# Patient Record
Sex: Female | Born: 1945
Health system: Southern US, Community
[De-identification: ages and names within clinical notes are randomized; demographics above are authoritative.]

## PROBLEM LIST (undated history)

## (undated) DIAGNOSIS — I10 Essential (primary) hypertension: Secondary | ICD-10-CM

## (undated) DIAGNOSIS — N390 Urinary tract infection, site not specified: Secondary | ICD-10-CM

## (undated) DIAGNOSIS — Z9889 Other specified postprocedural states: Secondary | ICD-10-CM

## (undated) DIAGNOSIS — H269 Unspecified cataract: Secondary | ICD-10-CM

## (undated) DIAGNOSIS — E782 Mixed hyperlipidemia: Secondary | ICD-10-CM

## (undated) DIAGNOSIS — M810 Age-related osteoporosis without current pathological fracture: Secondary | ICD-10-CM

## (undated) HISTORY — DX: Other specified postprocedural states: Z98.890

## (undated) HISTORY — DX: Mixed hyperlipidemia: E78.2

## (undated) HISTORY — PX: CYSTOSCOPY: SUR368

## (undated) HISTORY — DX: Urinary tract infection, site not specified: N39.0

## (undated) HISTORY — PX: LIPOMA EXCISION: SHX5283

## (undated) HISTORY — PX: FOOT SURGERY: SHX648

## (undated) HISTORY — DX: Age-related osteoporosis without current pathological fracture: M81.0

## (undated) HISTORY — DX: Essential (primary) hypertension: I10

## (undated) HISTORY — DX: Unspecified cataract: H26.9

## (undated) HISTORY — PX: ABDOMINAL HYSTERECTOMY: SHX81

---

## 2001-04-29 ENCOUNTER — Other Ambulatory Visit: Admission: RE | Admit: 2001-04-29 | Discharge: 2001-04-29 | Payer: Self-pay | Admitting: Obstetrics and Gynecology

## 2002-07-23 ENCOUNTER — Ambulatory Visit (HOSPITAL_COMMUNITY): Admission: RE | Admit: 2002-07-23 | Discharge: 2002-07-23 | Payer: Self-pay | Admitting: Pulmonary Disease

## 2004-05-08 ENCOUNTER — Ambulatory Visit (HOSPITAL_COMMUNITY): Admission: RE | Admit: 2004-05-08 | Discharge: 2004-05-08 | Payer: Self-pay | Admitting: Family Medicine

## 2004-06-20 ENCOUNTER — Ambulatory Visit (HOSPITAL_COMMUNITY): Admission: RE | Admit: 2004-06-20 | Discharge: 2004-06-20 | Payer: Self-pay | Admitting: Urology

## 2004-06-20 ENCOUNTER — Ambulatory Visit (HOSPITAL_BASED_OUTPATIENT_CLINIC_OR_DEPARTMENT_OTHER): Admission: RE | Admit: 2004-06-20 | Discharge: 2004-06-20 | Payer: Self-pay | Admitting: Urology

## 2004-09-07 ENCOUNTER — Ambulatory Visit: Payer: Self-pay | Admitting: Internal Medicine

## 2006-03-19 ENCOUNTER — Ambulatory Visit: Payer: Self-pay | Admitting: *Deleted

## 2006-03-26 ENCOUNTER — Ambulatory Visit (HOSPITAL_COMMUNITY): Admission: RE | Admit: 2006-03-26 | Discharge: 2006-03-26 | Payer: Self-pay | Admitting: Family Medicine

## 2007-03-25 ENCOUNTER — Ambulatory Visit: Payer: Self-pay | Admitting: *Deleted

## 2007-04-01 ENCOUNTER — Ambulatory Visit (HOSPITAL_COMMUNITY): Admission: RE | Admit: 2007-04-01 | Discharge: 2007-04-01 | Payer: Self-pay | Admitting: Family Medicine

## 2007-05-26 ENCOUNTER — Ambulatory Visit (HOSPITAL_COMMUNITY): Admission: RE | Admit: 2007-05-26 | Discharge: 2007-05-26 | Payer: Self-pay | Admitting: Family Medicine

## 2010-07-12 ENCOUNTER — Ambulatory Visit (HOSPITAL_COMMUNITY): Admission: RE | Admit: 2010-07-12 | Discharge: 2010-07-12 | Payer: Self-pay | Admitting: Family Medicine

## 2011-03-06 NOTE — Letter (Signed)
March 25, 2007    Corrie Mckusick, M.D.  [address] [place]. [place], [place] [address]   RE:  [name], [name]  MRN:  [id number]  /  DOB:  1945/12/11   Dear Dr. Phillips Odor,   It was a pleasure to see your nice patient, [name], for followup  on March 25, 2007. As you know, she is a very pleasant, 65 year old, white  female with a history of multiple risk factors and no significant  obstructive coronary disease at the time of coronary angiography in  1998.   Stress Cardiolite in February 2004 revealed normal systolic function  with normal myocardial perfusion. In February 2005, she was evaluated  for syncope. She also has a urinary tract infection per Dr. Eudelia Bunch.  She has a history of hyperlipidemia and is unable to take statins,  cigarette abuse. She has regained some of the previous weight loss.   MEDICATIONS:  Aspirin 81 and Macrodantin.   PHYSICAL EXAMINATION:  VITAL SIGNS:  Blood pressure 166/104, this was  confirmed. The patient states that this is unusual for her. Pulse was  90, normal sinus rhythm.  GENERAL:  She appears pale but her mucous membranes are not pail.  HEENT:  JVP is not elevated. Carotid pulses are palpable and equal  without bruits.  LUNGS:  Clear.  CARDIAC:  Reveals no significant murmur.  ABDOMEN:  Unremarkable.  EXTREMITIES:  Normal.   EKG reveals normal sinus rhythm, left anterior fascicular block and ST-T  abnormalities.   IMPRESSION:  1. Significant hypertension.  2. Hyperlipidemia.  3. Abnormal EKG with ST-T abnormality, prolonged QT possibly related      to hypertension.   I suggested Lotrel 5/10; however, the patient states that she will  probably not take it. She states that she is to see you next week. I  certainly encouraged her to take her medications.   I have suggested she see Dr. Dietrich Pates in our [place] office in 6  months or sooner if necessary.   Thank you for the opportunity to share in this nice patient's  care.    Sincerely,      E. Graceann Congress, MD, [facility]  Electronically Signed    EJL/MedQ  DD: 03/25/2007  DT: 03/26/2007  Job #: [id number]

## 2011-03-09 NOTE — Op Note (Signed)
NAME:  [name], [name]                          ACCOUNT NO.:  [id number]   MEDICAL RECORD NO.:  [id number]                   PATIENT TYPE:  AMB   LOCATION:  [facility]                                 FACILITY:  [facility]   PHYSICIAN:  Jamison Neighbor, M.D.               DATE OF BIRTH:  [birth year]   DATE OF PROCEDURE:  06/20/2004  DATE OF DISCHARGE:                                 OPERATIVE REPORT   PREOPERATIVE DIAGNOSES:  1.  Chronic cystitis.  2.  Microscopic hematuria.   POSTOPERATIVE DIAGNOSES:  1.  Chronic cystitis.  2.  Microscopic hematuria.   PROCEDURE:  1.  Cystoscopy.  2.  Bilateral retrogrades.   SURGEON:  Jamison Neighbor, M.D.   ANESTHESIA:  General.   COMPLICATIONS:  None.   DRAINS:  None.   BRIEF HISTORY:  This 65 year old female has had longstanding problems with  both chronic cystitis and microscopic hematuria.  She has had previous upper  tract studies as well as office cystoscopy but has had persistent hematuria  as well as poorly controlled infections.  She is now to undergo cystoscopy  and retrogrades along with biopsy if appropriate.  She understands the risks  and benefits of the procedure and gave full informed consent.   DESCRIPTION OF PROCEDURE:  After the successful induction of general  anesthesia, the patient was placed in the dorsal lithotomy position, prepped  with Betadine, and draped in the usual sterile fashion.  Careful bimanual  examination revealed no abnormalities of the bladder or rectum.  There was  no cystocele, rectocele, or enterocele.  There were no masses on bimanual  exam.  The urethra was of normal caliber accepting a 55 [place] female  urethral sound with no evidence of stenosis or stricture.  The cystoscope  was inserted, and the bladder was carefully inspected.  It was free of any  tumor or stones.  Both ureteral orifices were normal in configuration and  location.  Both the 12-degree and 70-degree lens were utilized.  Retrograde  studies were performed bilaterally through a 6 French open-end catheter.  The patient had normal ureters with no strictures or filling defects and  normal collecting system with multiple finely cupped calices and no signs of  hydronephrosis or mass lesion.  The bladder was drained.  The patient  tolerated the procedure well and was taken to the recovery room in good  condition.                                              Jamison Neighbor, M.D.   RJE/MEDQ  D:  06/20/2004  T:  06/20/2004  Job:  [id number]

## 2011-03-21 ENCOUNTER — Other Ambulatory Visit: Payer: Self-pay | Admitting: Ophthalmology

## 2011-03-21 ENCOUNTER — Encounter (HOSPITAL_COMMUNITY): Payer: Medicare Other

## 2011-03-21 LAB — HEMOGLOBIN AND HEMATOCRIT, BLOOD
HCT: 37.8 % (ref 36.0–46.0)
Hemoglobin: 12.1 g/dL (ref 12.0–15.0)

## 2011-03-21 LAB — BASIC METABOLIC PANEL
Calcium: 10 mg/dL (ref 8.4–10.5)
Chloride: 99 mEq/L (ref 96–112)
Creatinine, Ser: 1.24 mg/dL — ABNORMAL HIGH (ref 0.4–1.2)
GFR calc Af Amer: 52 mL/min — ABNORMAL LOW (ref >60)

## 2011-03-22 ENCOUNTER — Ambulatory Visit (HOSPITAL_COMMUNITY)
Admission: RE | Admit: 2011-03-22 | Discharge: 2011-03-22 | Disposition: A | Discharge status: Home / Self Care | Payer: Medicare Other | Source: Ambulatory Visit | Attending: Ophthalmology | Admitting: Ophthalmology

## 2011-03-22 DIAGNOSIS — J4489 Other specified chronic obstructive pulmonary disease: Secondary | ICD-10-CM | POA: Insufficient documentation

## 2011-03-22 DIAGNOSIS — H2510 Age-related nuclear cataract, unspecified eye: Secondary | ICD-10-CM | POA: Insufficient documentation

## 2011-03-22 DIAGNOSIS — I10 Essential (primary) hypertension: Secondary | ICD-10-CM | POA: Insufficient documentation

## 2011-03-22 DIAGNOSIS — Z01812 Encounter for preprocedural laboratory examination: Secondary | ICD-10-CM | POA: Insufficient documentation

## 2011-03-22 DIAGNOSIS — Z79899 Other long term (current) drug therapy: Secondary | ICD-10-CM | POA: Insufficient documentation

## 2011-03-22 DIAGNOSIS — J449 Chronic obstructive pulmonary disease, unspecified: Secondary | ICD-10-CM | POA: Insufficient documentation

## 2011-04-02 NOTE — Op Note (Signed)
  NAMEAIZLYN, SCHIFANO                ACCOUNT NO.:  [id number]  MEDICAL RECORD NO.:  [id number]           PATIENT TYPE:  O  LOCATION:  [facility]                          FACILITY:  [facility]  PHYSICIAN:  Susanne Greenhouse, MD       DATE OF BIRTH:  Feb 22, 1946  DATE OF PROCEDURE:  03/22/2011 DATE OF DISCHARGE:                              OPERATIVE REPORT   PREOPERATIVE DIAGNOSIS:  Nuclear cataract, left eye, diagnosis code 366.16.  POSTOPERATIVE DIAGNOSIS:  Nuclear cataract, left eye, diagnosis code 366.16.  OPERATION PERFORMED:  Phacoemulsification with posterior chamber intraocular lens implantation, left eye.  SURGEON:  Susanne Greenhouse, MD  ANESTHESIA:  General endotracheal anesthesia.  OPERATIVE SUMMARY:  In the preoperative area, dilating drops were placed into the left eye.  The patient was then brought into the operating room where she was placed under general anesthesia.  The eye was then prepped and draped.  Beginning with a 75 blade, a paracentesis port was made at the surgeon's 2 o'clock position.  The anterior chamber was then filled with a 1% nonpreserved lidocaine solution with epinephrine.  This was followed by Viscoat to deepen the chamber.  A small fornix-based peritomy was performed superiorly.  Next, a single iris hook was placed through the limbus superiorly.  A 2.4-mm keratome blade was then used to make a clear corneal incision over the iris hook.  A bent cystotome needle and Utrata forceps were used to create a continuous tear capsulotomy.  Hydrodissection was performed using balanced salt solution on a fine cannula.  The lens nucleus was then removed using phacoemulsification in a quadrant cracking technique.  The cortical material was then removed with irrigation and aspiration.  The capsular bag and anterior chamber were refilled with Provisc.  The wound was widened to approximately 3 mm and a posterior chamber intraocular lens was placed into the capsular bag without  difficulty using an [employer] lens injecting system.  A single 10-0 nylon suture was then used to close the incision as well as stromal hydration.  The Provisc was removed from the anterior chamber and capsular bag with irrigation and aspiration.  At this point, the wounds were tested for leak, which were negative.  The anterior chamber remained deep and stable.  The patient tolerated the procedure well.  There were no operative complications, and she awoke from general anesthesia without problem.  Prosthetic device used is a Lenstec posterior chamber lens model Softec HD, power of 21, serial number is [id number].          ______________________________ Susanne Greenhouse, MD     KEH/MEDQ  D:  03/22/2011  T:  03/23/2011  Job:  [id number]  Electronically Signed by Gemma Payor MD on 04/02/2011 12:22:25 PM

## 2011-06-18 ENCOUNTER — Other Ambulatory Visit (HOSPITAL_COMMUNITY): Payer: Self-pay | Admitting: Urology

## 2011-06-18 DIAGNOSIS — IMO0002 Reserved for concepts with insufficient information to code with codable children: Secondary | ICD-10-CM

## 2011-06-21 ENCOUNTER — Other Ambulatory Visit (HOSPITAL_COMMUNITY): Payer: Medicare Other

## 2011-06-26 ENCOUNTER — Ambulatory Visit (HOSPITAL_COMMUNITY)
Admission: RE | Admit: 2011-06-26 | Discharge: 2011-06-26 | Disposition: A | Discharge status: Home / Self Care | Payer: Medicare Other | Source: Ambulatory Visit | Attending: Urology | Admitting: Urology

## 2011-06-26 DIAGNOSIS — IMO0002 Reserved for concepts with insufficient information to code with codable children: Secondary | ICD-10-CM

## 2011-06-26 DIAGNOSIS — R319 Hematuria, unspecified: Secondary | ICD-10-CM | POA: Insufficient documentation

## 2011-06-26 DIAGNOSIS — N271 Small kidney, bilateral: Secondary | ICD-10-CM | POA: Insufficient documentation

## 2011-09-05 ENCOUNTER — Encounter (HOSPITAL_COMMUNITY): Payer: Self-pay | Admitting: Pharmacy Technician

## 2011-09-11 ENCOUNTER — Encounter (HOSPITAL_COMMUNITY)
Admission: RE | Admit: 2011-09-11 | Discharge: 2011-09-11 | Disposition: A | Discharge status: Home / Self Care | Payer: Medicare Other | Source: Ambulatory Visit | Attending: Ophthalmology | Admitting: Ophthalmology

## 2011-09-11 ENCOUNTER — Encounter (HOSPITAL_COMMUNITY): Payer: Self-pay

## 2011-09-11 LAB — CBC
Hemoglobin: 11.4 g/dL — ABNORMAL LOW (ref 12.0–15.0)
MCHC: 32.1 g/dL (ref 30.0–36.0)
Platelets: 217 10*3/uL (ref 150–400)
RDW: 13.6 % (ref 11.5–15.5)

## 2011-09-11 LAB — BASIC METABOLIC PANEL
GFR calc Af Amer: 37 mL/min — ABNORMAL LOW (ref >90)
GFR calc non Af Amer: 32 mL/min — ABNORMAL LOW (ref >90)
Potassium: 3.8 mEq/L (ref 3.5–5.1)
Sodium: 138 mEq/L (ref 135–145)

## 2011-09-11 NOTE — Patient Instructions (Addendum)
20 SANTANNA WHITFORD  09/11/2011   Your procedure is scheduled on:  09/17/2011  [facility] to [facility] at [facility]30 AM.  Call this number if you have problems the morning of surgery: [phone]   Remember:   Do not eat food:After Midnight.  Do not drink clear liquids: After Midnight.  Take these medicines the morning of surgery with A SIP OF WATER:    Do not wear jewelry, make-up or nail polish.  Do not wear lotions, powders, or perfumes. You may wear deodorant.  Do not shave 48 hours prior to surgery.  Do not bring valuables to the hospital.  Contacts, dentures or bridgework may not be worn into surgery.  Leave suitcase in the car. After surgery it may be brought to your room.  For patients admitted to the hospital, checkout time is 11:00 AM the day of discharge.   Patients discharged the day of surgery will not be allowed to drive home.  Name and phone number of your driver:   Special Instructions: N/A   Please read over the following fact sheets that you were given: Pain Booklet

## 2011-09-17 ENCOUNTER — Ambulatory Visit (HOSPITAL_COMMUNITY): Payer: Medicare Other | Admitting: Anesthesiology

## 2011-09-17 ENCOUNTER — Encounter (HOSPITAL_COMMUNITY): Payer: Self-pay | Admitting: Anesthesiology

## 2011-09-17 ENCOUNTER — Ambulatory Visit (HOSPITAL_COMMUNITY)
Admission: RE | Admit: 2011-09-17 | Discharge: 2011-09-17 | Disposition: A | Discharge status: Home / Self Care | Payer: Medicare Other | Source: Ambulatory Visit | Attending: Ophthalmology | Admitting: Ophthalmology

## 2011-09-17 ENCOUNTER — Encounter (HOSPITAL_COMMUNITY): Payer: Self-pay

## 2011-09-17 ENCOUNTER — Encounter (HOSPITAL_COMMUNITY)
Admission: RE | Disposition: A | Discharge status: Home / Self Care | Payer: Self-pay | Source: Ambulatory Visit | Attending: Ophthalmology

## 2011-09-17 DIAGNOSIS — H2510 Age-related nuclear cataract, unspecified eye: Secondary | ICD-10-CM | POA: Insufficient documentation

## 2011-09-17 DIAGNOSIS — Z01812 Encounter for preprocedural laboratory examination: Secondary | ICD-10-CM | POA: Insufficient documentation

## 2011-09-17 DIAGNOSIS — J449 Chronic obstructive pulmonary disease, unspecified: Secondary | ICD-10-CM | POA: Insufficient documentation

## 2011-09-17 DIAGNOSIS — Z79899 Other long term (current) drug therapy: Secondary | ICD-10-CM | POA: Insufficient documentation

## 2011-09-17 DIAGNOSIS — I10 Essential (primary) hypertension: Secondary | ICD-10-CM | POA: Insufficient documentation

## 2011-09-17 DIAGNOSIS — J4489 Other specified chronic obstructive pulmonary disease: Secondary | ICD-10-CM | POA: Insufficient documentation

## 2011-09-17 HISTORY — PX: CATARACT EXTRACTION W/PHACO: SHX586

## 2011-09-17 SURGERY — PHACOEMULSIFICATION, CATARACT, WITH IOL INSERTION
Anesthesia: Monitor Anesthesia Care | Site: Eye | Laterality: Right | Wound class: Clean

## 2011-09-17 MED ORDER — MIDAZOLAM HCL 5 MG/5ML IJ SOLN
INTRAMUSCULAR | Status: DC | PRN
  Administered 2011-09-17 (×2): 1 mg via INTRAVENOUS

## 2011-09-17 MED ORDER — EPINEPHRINE HCL 1 MG/ML IJ SOLN
INTRAOCULAR | Status: DC | PRN
  Administered 2011-09-17: 08:00:00

## 2011-09-17 MED ORDER — LIDOCAINE HCL 3.5 % OP GEL: 1.0000 "application " | Freq: Once | OPHTHALMIC | Status: DC

## 2011-09-17 MED ORDER — LACTATED RINGERS IV SOLN
INTRAVENOUS | Status: DC
  Administered 2011-09-17: 08:00:00 via INTRAVENOUS

## 2011-09-17 MED ORDER — NEOMYCIN-POLYMYXIN-DEXAMETH 3.5-10000-0.1 OP OINT
TOPICAL_OINTMENT | OPHTHALMIC | Status: AC
  Filled 2011-09-17: qty 3.5

## 2011-09-17 MED ORDER — LIDOCAINE HCL (PF) 1 % IJ SOLN
INTRAMUSCULAR | Status: AC
  Filled 2011-09-17: qty 2

## 2011-09-17 MED ORDER — NEOMYCIN-POLYMYXIN-DEXAMETH 0.1 % OP OINT
TOPICAL_OINTMENT | OPHTHALMIC | Status: DC | PRN
  Administered 2011-09-17: 1 via OPHTHALMIC

## 2011-09-17 MED ORDER — MIDAZOLAM HCL 2 MG/2ML IJ SOLN
1.0000 mg | INTRAMUSCULAR | Status: DC | PRN
Start: 2011-09-17 — End: 2011-09-17
  Administered 2011-09-17: 2 mg via INTRAVENOUS

## 2011-09-17 MED ORDER — BSS IO SOLN
INTRAOCULAR | Status: DC | PRN
  Administered 2011-09-17: 15 mL via INTRAOCULAR

## 2011-09-17 MED ORDER — TETRACAINE HCL 0.5 % OP SOLN
1.0000 [drp] | OPHTHALMIC | Status: AC
  Administered 2011-09-17 (×3): 1 [drp] via OPHTHALMIC

## 2011-09-17 MED ORDER — PHENYLEPHRINE HCL 2.5 % OP SOLN
1.0000 [drp] | OPHTHALMIC | Status: AC
  Administered 2011-09-17 (×3): 1 [drp] via OPHTHALMIC

## 2011-09-17 MED ORDER — CYCLOPENTOLATE-PHENYLEPHRINE 0.2-1 % OP SOLN
OPHTHALMIC | Status: AC
  Administered 2011-09-17: 1 [drp] via OPHTHALMIC
  Filled 2011-09-17: qty 2

## 2011-09-17 MED ORDER — EPINEPHRINE HCL 1 MG/ML IJ SOLN
INTRAMUSCULAR | Status: AC
  Filled 2011-09-17: qty 1

## 2011-09-17 MED ORDER — NA HYALUR & NA CHOND-NA HYALUR 0.55-0.5 ML IO KIT
PACK | INTRAOCULAR | Status: DC | PRN
  Administered 2011-09-17: 1 via OPHTHALMIC

## 2011-09-17 MED ORDER — PHENYLEPHRINE HCL 2.5 % OP SOLN
OPHTHALMIC | Status: AC
  Administered 2011-09-17: 1 [drp] via OPHTHALMIC
  Filled 2011-09-17: qty 2

## 2011-09-17 MED ORDER — CYCLOPENTOLATE-PHENYLEPHRINE 0.2-1 % OP SOLN
1.0000 [drp] | OPHTHALMIC | Status: AC
  Administered 2011-09-17 (×3): 1 [drp] via OPHTHALMIC

## 2011-09-17 MED ORDER — LIDOCAINE 3.5 % OP GEL OPTIME - NO CHARGE
OPHTHALMIC | Status: DC | PRN
  Administered 2011-09-17: 1 [drp] via OPHTHALMIC

## 2011-09-17 MED ORDER — POVIDONE-IODINE 5 % OP SOLN
OPHTHALMIC | Status: DC | PRN
  Administered 2011-09-17: 1 via OPHTHALMIC

## 2011-09-17 MED ORDER — MIDAZOLAM HCL 2 MG/2ML IJ SOLN
INTRAMUSCULAR | Status: AC
  Administered 2011-09-17: 2 mg via INTRAVENOUS
  Filled 2011-09-17: qty 2

## 2011-09-17 MED ORDER — LIDOCAINE HCL (PF) 1 % IJ SOLN
INTRAMUSCULAR | Status: DC | PRN
  Administered 2011-09-17: .4 mL

## 2011-09-17 MED ORDER — TETRACAINE HCL 0.5 % OP SOLN
OPHTHALMIC | Status: AC
  Administered 2011-09-17: 1 [drp] via OPHTHALMIC
  Filled 2011-09-17: qty 2

## 2011-09-17 MED ORDER — LIDOCAINE HCL 3.5 % OP GEL
OPHTHALMIC | Status: AC
  Filled 2011-09-17: qty 5

## 2011-09-17 MED ORDER — MIDAZOLAM HCL 2 MG/2ML IJ SOLN
INTRAMUSCULAR | Status: AC
  Filled 2011-09-17: qty 2

## 2011-09-17 SURGICAL SUPPLY — 31 items
CAPSULAR TENSION RING-AMO (OPHTHALMIC RELATED) IMPLANT
CLOTH BEACON ORANGE TIMEOUT ST (SAFETY) ×2 IMPLANT
EYE SHIELD UNIVERSAL CLEAR (GAUZE/BANDAGES/DRESSINGS) ×2 IMPLANT
GLOVE BIO SURGEON STRL SZ 6.5 (GLOVE) IMPLANT
GLOVE BIOGEL PI IND STRL 6.5 (GLOVE) IMPLANT
GLOVE BIOGEL PI IND STRL 7.0 (GLOVE) ×1 IMPLANT
GLOVE BIOGEL PI IND STRL 7.5 (GLOVE) IMPLANT
GLOVE BIOGEL PI INDICATOR 6.5 (GLOVE)
GLOVE BIOGEL PI INDICATOR 7.0 (GLOVE) ×1
GLOVE BIOGEL PI INDICATOR 7.5 (GLOVE)
GLOVE ECLIPSE 6.5 STRL STRAW (GLOVE) IMPLANT
GLOVE ECLIPSE 7.0 STRL STRAW (GLOVE) IMPLANT
GLOVE ECLIPSE 7.5 STRL STRAW (GLOVE) IMPLANT
GLOVE EXAM NITRILE LRG STRL (GLOVE) IMPLANT
GLOVE EXAM NITRILE MD LF STRL (GLOVE) ×2 IMPLANT
GLOVE SKINSENSE NS SZ6.5 (GLOVE)
GLOVE SKINSENSE NS SZ7.0 (GLOVE)
GLOVE SKINSENSE STRL SZ6.5 (GLOVE) IMPLANT
GLOVE SKINSENSE STRL SZ7.0 (GLOVE) IMPLANT
KIT VITRECTOMY (OPHTHALMIC RELATED) IMPLANT
PAD ARMBOARD 7.5X6 YLW CONV (MISCELLANEOUS) ×2 IMPLANT
PROC W NO LENS (INTRAOCULAR LENS)
PROC W SPEC LENS (INTRAOCULAR LENS)
PROCESS W NO LENS (INTRAOCULAR LENS) IMPLANT
PROCESS W SPEC LENS (INTRAOCULAR LENS) IMPLANT
RING MALYGIN (MISCELLANEOUS) IMPLANT
SIGHTPATH CAT PROC W REG LENS (Ophthalmic Related) ×2 IMPLANT
SYR TB 1ML LL NO SAFETY (SYRINGE) ×2 IMPLANT
TAPE TRANSPARENT 1/2IN (GAUZE/BANDAGES/DRESSINGS) ×2 IMPLANT
VISCOELASTIC ADDITIONAL (OPHTHALMIC RELATED) IMPLANT
WATER STERILE IRR 250ML POUR (IV SOLUTION) ×2 IMPLANT

## 2011-09-17 NOTE — H&P (Signed)
I have reviewed the H&P, the patient was re-examined, and I have identified no interval changes in medical condition and plan of care since the history and physical of record  

## 2011-09-17 NOTE — Brief Op Note (Signed)
Pre-Op Dx: Cataract OD Post-Op Dx: Cataract OD Surgeon: Rayona Sardinha Anesthesia: Topical with MAC Implant: Lenstec, Model Softec HD Blood Loss: None Specimen: None Complications: None 

## 2011-09-17 NOTE — Anesthesia Preprocedure Evaluation (Signed)
Anesthesia Evaluation  Patient identified by MRN, date of birth, ID band Patient awake    Reviewed: Allergy & Precautions  History of Anesthesia Complications (+) PONV  Airway Mallampati: II      Dental  (+) Edentulous Upper and Partial Lower   Pulmonary COPDCurrent Smoker,    Pulmonary exam normal       Cardiovascular hypertension, Pt. on medications Regular Normal    Neuro/Psych PSYCHIATRIC DISORDERS Anxiety    GI/Hepatic   Endo/Other    Renal/GU      Musculoskeletal   Abdominal   Peds  Hematology   Anesthesia Other Findings   Reproductive/Obstetrics                           Anesthesia Physical Anesthesia Plan  ASA: II  Anesthesia Plan: MAC   Post-op Pain Management:    Induction:   Airway Management Planned: Nasal Cannula  Additional Equipment:   Intra-op Plan:   Post-operative Plan:   Informed Consent: I have reviewed the patients History and Physical, chart, labs and discussed the procedure including the risks, benefits and alternatives for the proposed anesthesia with the patient or authorized representative who has indicated his/her understanding and acceptance.     Plan Discussed with:   Anesthesia Plan Comments:         Anesthesia Quick Evaluation

## 2011-09-17 NOTE — Anesthesia Postprocedure Evaluation (Signed)
  Anesthesia Post-op Note  Patient: [name]  Procedure(s) Performed:  CATARACT EXTRACTION PHACO AND INTRAOCULAR LENS PLACEMENT (IOC) - CDE:23.52  Patient Location:  Short Stay  Anesthesia Type: MAC  Level of Consciousness: awake  Airway and Oxygen Therapy: Patient Spontanous Breathing  Post-op Pain: none  Post-op Assessment: Post-op Vital signs reviewed, Patient's Cardiovascular Status Stable, Respiratory Function Stable, Patent Airway, No signs of Nausea or vomiting and Pain level controlled  Post-op Vital Signs: Reviewed and stable  Complications: No apparent anesthesia complications

## 2011-09-17 NOTE — Transfer of Care (Signed)
Immediate Anesthesia Transfer of Care Note  Patient: [name]  Procedure(s) Performed:  CATARACT EXTRACTION PHACO AND INTRAOCULAR LENS PLACEMENT (IOC) - CDE:23.52  Patient Location: Shortstay  Anesthesia Type: MAC  Level of Consciousness: awake  Airway & Oxygen Therapy: Patient Spontanous Breathing   Post-op Assessment: Report given to PACU RN, Post -op Vital signs reviewed and stable and Patient moving all extremities  Post vital signs: Reviewed and stable  Complications: No apparent anesthesia complications

## 2011-09-17 NOTE — Anesthesia Procedure Notes (Signed)
Procedure Name: MAC Date/Time: 09/17/2011 7:59 AM Performed by: Minerva Areola Pre-anesthesia Checklist: Patient identified, Patient being monitored, Emergency Drugs available, Timeout performed and Suction available Patient Re-evaluated:Patient Re-evaluated prior to inductionOxygen Delivery Method: Nasal Cannula

## 2011-09-17 NOTE — Op Note (Signed)
[name], [name]                ACCOUNT NO.:  [id number]  MEDICAL RECORD NO.:  [id number]  LOCATION:  [facility]                          FACILITY:  [facility]  PHYSICIAN:  Susanne Greenhouse, MD       DATE OF BIRTH:  03/14/1946  DATE OF PROCEDURE:  09/17/2011 DATE OF DISCHARGE:  09/17/2011                              OPERATIVE REPORT   PREOPERATIVE DIAGNOSIS:  Nuclear cataract, right eye.  Diagnosis code 366.16.  POSTOPERATIVE DIAGNOSIS:  Nuclear cataract, right eye.  Diagnosis code 366.16.  OPERATION PERFORMED:  Phacoemulsification with posterior chamber intraocular lens implantation, right eye.  SURGEON:  Bonne Dolores. Maysun Meditz, MD  ANESTHESIA:  General endotracheal anesthesia.  OPERATIVE SUMMARY:  In the preoperative area, dilating drops were placed into the right eye.  The patient was then brought into the operating room where he was placed under general anesthesia.  The eye was then prepped and draped.  Beginning with a 75 blade, a paracentesis port was made at the surgeon's 2 o'clock position.  The anterior chamber was then filled with a 1% nonpreserved lidocaine solution with epinephrine.  This was followed by Viscoat to deepen the chamber.  A small fornix-based peritomy was performed superiorly.  Next, a single iris hook was placed through the limbus superiorly.  A 2.4-mm keratome blade was then used to make a clear corneal incision over the iris hook.  A bent cystotome needle and Utrata forceps were used to create a continuous tear capsulotomy.  Hydrodissection was performed using balanced salt solution on a fine cannula.  The lens nucleus was then removed using phacoemulsification in a quadrant cracking technique.  The cortical material was then removed with irrigation and aspiration.  The capsular bag and anterior chamber were refilled with Provisc.  The wound was widened to approximately 3 mm and a posterior chamber intraocular lens was placed into the capsular bag without difficulty  using an [employer] lens injecting system.  A single 10-0 nylon suture was then used to close the incision as well as stromal hydration.  The Provisc was removed from the anterior chamber and capsular bag with irrigation and aspiration.  At this point, the wounds were tested for leak, which were negative.  The anterior chamber remained deep and stable.  The patient tolerated the procedure well.  There were no operative complications, and he awoke from general anesthesia without problem.  No surgical specimens.  Prosthetic device used is a Lenstec posterior chamber lens, model Softec HD, power of 20.0, serial number is [id number].          ______________________________ Susanne Greenhouse, MD     KEH/MEDQ  D:  09/17/2011  T:  09/17/2011  Job:  [id number]

## 2011-09-21 ENCOUNTER — Encounter (HOSPITAL_COMMUNITY): Payer: Self-pay | Admitting: Ophthalmology

## 2012-06-04 ENCOUNTER — Other Ambulatory Visit (HOSPITAL_COMMUNITY): Payer: Self-pay | Admitting: Internal Medicine

## 2012-06-04 DIAGNOSIS — M810 Age-related osteoporosis without current pathological fracture: Secondary | ICD-10-CM

## 2012-06-09 ENCOUNTER — Ambulatory Visit (HOSPITAL_COMMUNITY)
Admission: RE | Admit: 2012-06-09 | Discharge: 2012-06-09 | Disposition: A | Discharge status: Home / Self Care | Payer: Medicare Other | Source: Ambulatory Visit | Attending: Internal Medicine | Admitting: Internal Medicine

## 2012-06-09 DIAGNOSIS — M810 Age-related osteoporosis without current pathological fracture: Secondary | ICD-10-CM | POA: Insufficient documentation

## 2012-06-11 ENCOUNTER — Other Ambulatory Visit (HOSPITAL_COMMUNITY): Payer: Medicare Other

## 2012-06-19 ENCOUNTER — Encounter: Payer: Self-pay | Admitting: Cardiology

## 2012-06-24 ENCOUNTER — Encounter: Payer: Self-pay | Admitting: Cardiology

## 2012-06-24 ENCOUNTER — Ambulatory Visit (INDEPENDENT_AMBULATORY_CARE_PROVIDER_SITE_OTHER): Payer: Medicare Other | Admitting: Cardiology

## 2012-06-24 ENCOUNTER — Ambulatory Visit: Payer: Medicare Other | Admitting: Cardiology

## 2012-06-24 VITALS — BP 120/78 | HR 88 | Ht 64.0 in | Wt 135.0 lb

## 2012-06-24 DIAGNOSIS — H269 Unspecified cataract: Secondary | ICD-10-CM

## 2012-06-24 DIAGNOSIS — R0989 Other specified symptoms and signs involving the circulatory and respiratory systems: Secondary | ICD-10-CM

## 2012-06-24 DIAGNOSIS — R0600 Dyspnea, unspecified: Secondary | ICD-10-CM

## 2012-06-24 DIAGNOSIS — I2510 Atherosclerotic heart disease of native coronary artery without angina pectoris: Secondary | ICD-10-CM

## 2012-06-24 DIAGNOSIS — N390 Urinary tract infection, site not specified: Secondary | ICD-10-CM

## 2012-06-24 DIAGNOSIS — E785 Hyperlipidemia, unspecified: Secondary | ICD-10-CM

## 2012-06-24 DIAGNOSIS — M810 Age-related osteoporosis without current pathological fracture: Secondary | ICD-10-CM

## 2012-06-24 DIAGNOSIS — I10 Essential (primary) hypertension: Secondary | ICD-10-CM

## 2012-06-24 DIAGNOSIS — R011 Cardiac murmur, unspecified: Secondary | ICD-10-CM

## 2012-06-24 NOTE — Progress Notes (Signed)
Clinical Summary [name] is a 66 y.o.female referred back to the office by Dr. Margo Aye. She is a former patient of Dr. Corinda Gubler, seen back in 2008. History is reviewed including prior documentation of mild nonobstructive CAD at cardiac catheterization several years ago. Also reports history of previous "valve problem" although details are not available.  She reports no symptoms of angina, has stable NYHA class 1-2 dyspnea and exertion. No palpitations or syncope. She is aware that she has a heart murmur.  Recent lab work reviewed finding potassium 4.3, BUN 16, creatinine 1.4, AST 29, ALT 14, hemoglobin 12.3, platelets 258, cholesterol 248, triglycerides 109, HDL 53, LDL 173. ECG today shows sinus rhythm with leftward axis, NST changes. She states that she was unable to tolerate statins describing substantial leg weakness. Also has not wanted to take Zetia with reported "bowel problems." She tries to focus on diet. She is not exercising regularly.  She has had no interval cardiac testing for several years.   Allergies  Allergen Reactions  . Eggs Or Egg-Derived Products Diarrhea  . Milk-Related Compounds Diarrhea  . Sulfa Antibiotics     Current Outpatient Prescriptions  Medication Sig Dispense Refill  . aspirin 81 MG tablet Take 81 mg by mouth daily.      . Chlorphen-PE-Acetaminophen (NOREL AD) 4-10-325 MG TABS Take 1 tablet by mouth daily as needed. sinus       . nitrofurantoin (MACRODANTIN) 50 MG capsule Take 50 mg by mouth daily.          Past Medical History  Diagnosis Date  . Essential hypertension, benign   . Cataract of right eye   . Recurrent UTI   . Osteoporosis   . Mixed hyperlipidemia   . History of cardiac catheterization     Reportedly no significant CAD 1998 - previously followed with Dr. Corinda Gubler    Past Surgical History  Procedure Date  . Lipoma excision     Back  . Abdominal hysterectomy   . Foot surgery   . Cataract extraction w/phaco 09/17/2011   Procedure: CATARACT EXTRACTION PHACO AND INTRAOCULAR LENS PLACEMENT (IOC);  Surgeon: Gemma Payor;  Location: [facility];  Service: Ophthalmology;  Laterality: Right;  CDE:23.52  . Cystoscopy     Family History  Problem Relation Age of Onset  . Stroke Mother   . Stroke Brother   . Hypertension Mother   . Hypertension Brother   . Diabetes Brother   . Cancer Father     Lung/Lymphoma  . Cancer Mother     Lymphoma    Social History Ms. Linford reports that she has quit smoking. Her smoking use included Cigarettes. She does not have any smokeless tobacco history on file. Ms. Pawlicki reports that she does not drink alcohol.  Review of Systems Negative except as outlined above.  Physical Examination Filed Vitals:   06/24/12 1442  BP: 120/78  Pulse: 88   Normally nourished appearing woman in no acute distress. HEENT: Conjunctiva and lids normal, oropharynx clear. Neck: Supple, no elevated JVP or carotid bruits, no thyromegaly. Lungs: Clear to auscultation, nonlabored breathing at rest. Cardiac: Regular rate and rhythm, no S3, 2/6 systolic murmur, no pericardial rub. Abdomen: Soft, nontender, bowel sounds present, no guarding or rebound. Extremities: No pitting edema, distal pulses 2+. Skin: Warm and dry. Musculoskeletal: No kyphosis. Neuropsychiatric: Alert and oriented x3, affect grossly appropriate.   Problem List and Plan   Coronary atherosclerosis of native coronary artery Reported history of mild nonobstructive disease, no followup in  several years. Her ECG is reviewed today, outlined above. No definite angina, although she does have uncontrolled lipids, has not been able to tolerate medical therapy. Plan will be a basic GXT for followup.  Cardiac murmur Followup echocardiogram to be obtained for further assessment. Details of prior diagnosis of "valve problems" unclear.  Essential hypertension, benign Blood pressure well controlled today. Patient states that this has been much  easier since she was able to lose approximately 35 pounds several years ago.  Hyperlipidemia Ideally LDL should at least be under 100. We discussed this today. She has had statin intolerance, does not want to try Zetia. Recommended increasing fiber in her diet and exercise.    Jonelle Sidle, M.D., F.A.C.C.

## 2012-06-24 NOTE — Patient Instructions (Addendum)
Your physician recommends that you schedule a follow-up appointment in: 1 year  .Your physician has requested that you have an exercise tolerance test. For further information please visit [email or web address] Please also follow instruction sheet, as given.  Your physician has requested that you have an echocardiogram. Echocardiography is a painless test that uses sound waves to create images of your heart. It provides your doctor with information about the size and shape of your heart and how well your heart's chambers and valves are working. This procedure takes approximately one hour. There are no restrictions for this procedure.

## 2012-06-24 NOTE — Assessment & Plan Note (Addendum)
Ideally LDL should at least be under 100. We discussed this today. She has had statin intolerance, does not want to try Zetia. Recommended increasing fiber in her diet and exercise.

## 2012-06-24 NOTE — Assessment & Plan Note (Signed)
Blood pressure well controlled today. Patient states that this has been much easier since she was able to lose approximately 35 pounds several years ago.

## 2012-06-24 NOTE — Assessment & Plan Note (Signed)
Reported history of mild nonobstructive disease, no followup in several years. Her ECG is reviewed today, outlined above. No definite angina, although she does have uncontrolled lipids, has not been able to tolerate medical therapy. Plan will be a basic GXT for followup.

## 2012-06-24 NOTE — Assessment & Plan Note (Signed)
Followup echocardiogram to be obtained for further assessment. Details of prior diagnosis of "valve problems" unclear.

## 2012-06-27 ENCOUNTER — Ambulatory Visit (HOSPITAL_COMMUNITY)
Admission: RE | Admit: 2012-06-27 | Discharge: 2012-06-27 | Disposition: A | Discharge status: Home / Self Care | Payer: Medicare Other | Source: Ambulatory Visit | Attending: Cardiology | Admitting: Cardiology

## 2012-06-27 DIAGNOSIS — I10 Essential (primary) hypertension: Secondary | ICD-10-CM | POA: Insufficient documentation

## 2012-06-27 DIAGNOSIS — R0989 Other specified symptoms and signs involving the circulatory and respiratory systems: Secondary | ICD-10-CM | POA: Insufficient documentation

## 2012-06-27 DIAGNOSIS — E785 Hyperlipidemia, unspecified: Secondary | ICD-10-CM | POA: Insufficient documentation

## 2012-06-27 DIAGNOSIS — R0609 Other forms of dyspnea: Secondary | ICD-10-CM | POA: Insufficient documentation

## 2012-06-27 DIAGNOSIS — I517 Cardiomegaly: Secondary | ICD-10-CM

## 2012-06-27 DIAGNOSIS — R0600 Dyspnea, unspecified: Secondary | ICD-10-CM

## 2012-06-27 NOTE — Progress Notes (Signed)
*  PRELIMINARY RESULTS* Echocardiogram 2D Echocardiogram has been performed.  Conrad [place] 06/27/2012, 4:02 PM

## 2012-07-03 ENCOUNTER — Ambulatory Visit (INDEPENDENT_AMBULATORY_CARE_PROVIDER_SITE_OTHER): Payer: Medicare Other | Admitting: Cardiology

## 2012-07-03 DIAGNOSIS — R0600 Dyspnea, unspecified: Secondary | ICD-10-CM

## 2012-07-03 DIAGNOSIS — I2510 Atherosclerotic heart disease of native coronary artery without angina pectoris: Secondary | ICD-10-CM

## 2012-07-03 NOTE — Progress Notes (Signed)
Stress Lab Nurses Notes - [facility]  ZOXWRUE BITHA FAUTEUX 07/03/2012 Reason for doing test: CAD Type of test: Regular GTX Nurse performing test: Parke Poisson, RN Nuclear Medicine Tech: Not Applicable Echo Tech: Not Applicable MD performing test: Ival Bible & Joni Reining NP Family MD: Margo Aye Test explained and consent signed: yes IV started: No IV started Symptoms: SOB & Discomfort in legs & feet Treatment/Intervention: None Reason test stopped: fatigue After recovery IV was: NA Patient to return to Nuc. Med at : NA Patient discharged: Home Patient's Condition upon discharge was: stable Comments: During test peak BP 202/78 & HR 141.  Recovery BP 162/88 & HR 76.  Symptoms resolved in recovery. Erskine Speed T

## 2012-07-03 NOTE — Progress Notes (Signed)
Attending note:  Baseline tracing shows sinus rhythm with nonspecific ST segment changes. She was exercised on a Bruce protocol for 3 minutes and 9 seconds achieving a maximum workload of 4.6 METs. Heart rate increased from 76 beats per minute up to 144 beats per minute representing 94% of the maximum age predicted heart rate response. Peak blood pressure was 202/78. She experienced no chest pain, but stated that due to foot discomfort she had to stop exercise. There were no clearly diagnostic ST segment abnormalities, with equivocal ST segment depression seen in recovery associated with T-wave inversions. Rare PVC noted. Overall negative study with limited exercise tolerance.  Jonelle Sidle, M.D., F.A.C.C.

## 2013-06-19 ENCOUNTER — Encounter: Payer: Self-pay | Admitting: Cardiology

## 2013-06-25 ENCOUNTER — Ambulatory Visit: Payer: Medicare Other | Admitting: Cardiology

## 2013-08-10 ENCOUNTER — Encounter: Payer: Self-pay | Admitting: Internal Medicine

## 2013-08-10 ENCOUNTER — Ambulatory Visit (INDEPENDENT_AMBULATORY_CARE_PROVIDER_SITE_OTHER): Payer: Medicare Other | Admitting: Internal Medicine

## 2013-08-10 VITALS — BP 148/90 | HR 88 | Ht 64.0 in | Wt 138.8 lb

## 2013-08-10 DIAGNOSIS — E785 Hyperlipidemia, unspecified: Secondary | ICD-10-CM

## 2013-08-10 DIAGNOSIS — I10 Essential (primary) hypertension: Secondary | ICD-10-CM

## 2013-08-10 NOTE — Patient Instructions (Signed)
Your physician wants you to follow-up in: 1 year  You will receive a reminder letter in the mail two months in advance. If you don't receive a letter, please call our office to schedule the follow-up appointment.  Your physician recommends that you continue on your current medications as directed. Please refer to the Current Medication list given to you today.  

## 2013-08-10 NOTE — Progress Notes (Signed)
HPI Patient is a 67 yo who comes for continued care She has a history of mild nonobstructive CAD by cath several years ago.   She was followed by Daphane Shepherd remotely then by Ival Bible.  She last saw him in 2013.  At that time echo was done that showed vigorous LV function  No valvular problems.  Patient also had a treadmill test done that showed poor conditioning but otherwise normal. SInce seen she has done OK  Breathing is stable  No CP  No palpittations.  No dizziness.      Allergies  Allergen Reactions  . Eggs Or Egg-Derived Products Diarrhea  . Milk-Related Compounds Diarrhea  . Sulfa Antibiotics     Current Outpatient Prescriptions  Medication Sig Dispense Refill  . aspirin 81 MG tablet Take 81 mg by mouth daily.      . Chlorphen-PE-Acetaminophen (NOREL AD) 4-10-325 MG TABS Take 1 tablet by mouth daily as needed. sinus       . nitrofurantoin (MACRODANTIN) 50 MG capsule Take 50 mg by mouth daily.         No current facility-administered medications for this visit.    Past Medical History  Diagnosis Date  . Essential hypertension, benign   . Cataract of right eye   . Recurrent UTI   . Osteoporosis   . Mixed hyperlipidemia   . History of cardiac catheterization     Reportedly no significant CAD 1998 - previously followed with Dr. Corinda Gubler    Past Surgical History  Procedure Laterality Date  . Lipoma excision      Back  . Abdominal hysterectomy    . Foot surgery    . Cataract extraction w/phaco  09/17/2011    Procedure: CATARACT EXTRACTION PHACO AND INTRAOCULAR LENS PLACEMENT (IOC);  Surgeon: Gemma Payor;  Location: [facility];  Service: Ophthalmology;  Laterality: Right;  CDE:23.52  . Cystoscopy      Family History  Problem Relation Age of Onset  . Stroke Mother   . Stroke Brother   . Hypertension Mother   . Hypertension Brother   . Diabetes Brother   . Cancer Father     Lung/Lymphoma  . Cancer Mother     Lymphoma    Social History Ms. Reames reports that she  has quit smoking. Her smoking use included Cigarettes. She smoked 0.00 packs per day. She does not have any smokeless tobacco history on file. Ms. Pfeifer reports that she does not drink alcohol.  Review of Systems Negative except as outlined above.  Physical Examination Filed Vitals:   08/10/13 1457  BP: 148/90  Pulse: 88    BP on my check was 160/100 Normally nourished appearing woman in no acute distress. HEENT: Conjunctiva and lids normal, oropharynx clear. Neck: Supple, no elevated JVP or carotid bruits, no thyromegaly. Lungs: Clear to auscultation, nonlabored breathing at rest. Cardiac: Regular rate and rhythm, no S3  No murmur  no pericardial rub. Abdomen: Soft, nontender, bowel sounds present, no guarding or rebound. Extremities: No pitting edema, distal pulses 2+. Skin: Warm and dry. Musculoskeletal: No kyphosis. Neuropsychiatric: Alert and oriented x3, affect grossly appropriate.  EKG  NSR  IRBBB.  LAFB  Nonspefic Problem List and Plan  1.  HTN  BP is high today  She says it is usually much better.  I have instructed her to take BP at home  Take log and cuff to [facility] office She will need it to be checked against another cuff  2.  HL  Will get lipids from [employer]  She did not tolerate statins  Has not wanted to try other meds.

## 2013-08-21 ENCOUNTER — Telehealth: Payer: Self-pay | Admitting: *Deleted

## 2013-08-21 NOTE — Telephone Encounter (Signed)
Lab work from dr [employer] reviewed by dr Tenny Craw, dr Tenny Craw recommends the following, LDL is extremely high-185 Has she tried livalo? Did not tolerate other statins. Would try livalo 1 mg per day-lipids and ast in 8 weeks If not-would try zetia Dietary consult.  Left message for pt to call

## 2013-08-21 NOTE — Telephone Encounter (Signed)
Spoke with pt, she is not interested in any cholesterol medicine even zetia. She is also not interested in the dietary consult. She is aware of the risk with not taking a statin.

## 2014-05-18 ENCOUNTER — Encounter: Payer: Self-pay | Admitting: Internal Medicine

## 2014-06-21 ENCOUNTER — Other Ambulatory Visit (HOSPITAL_COMMUNITY): Payer: Self-pay | Admitting: Internal Medicine

## 2014-06-21 DIAGNOSIS — M810 Age-related osteoporosis without current pathological fracture: Secondary | ICD-10-CM

## 2014-06-24 ENCOUNTER — Other Ambulatory Visit (HOSPITAL_COMMUNITY): Payer: Medicare Other

## 2014-06-25 ENCOUNTER — Ambulatory Visit (HOSPITAL_COMMUNITY)
Admission: RE | Admit: 2014-06-25 | Discharge: 2014-06-25 | Disposition: A | Discharge status: Home / Self Care | Payer: Medicare Other | Source: Ambulatory Visit | Attending: Internal Medicine | Admitting: Internal Medicine

## 2014-06-25 DIAGNOSIS — M810 Age-related osteoporosis without current pathological fracture: Secondary | ICD-10-CM | POA: Diagnosis present

## 2014-08-12 NOTE — Progress Notes (Signed)
HPI Patient is a 68 yo who comes for continued care She has a history of mild nonobstructive CAD by cath several years ago.   She was followed by Daphane Shepherd remotely then by Ival Bible.  She last saw him in 2013.  At that time echo was done that showed vigorous LV function  No valvular problems.  Patient also had a treadmill test done that showed poor conditioning but otherwise normal.s.    I saw the patient in Oct 2014 Since seen she has done well  Denies CP  Breathing is OK  No  Dizziness.  No palpiations.   Allergies  Allergen Reactions  . Eggs Or Egg-Derived Products Diarrhea  . Milk-Related Compounds Diarrhea  . Sulfa Antibiotics     Current Outpatient Prescriptions  Medication Sig Dispense Refill  . aspirin 81 MG tablet Take 81 mg by mouth daily.      . Chlorphen-PE-Acetaminophen (NOREL AD) 4-10-325 MG TABS Take 1 tablet by mouth daily as needed. sinus       . nitrofurantoin (MACRODANTIN) 50 MG capsule Take 50 mg by mouth daily.        . Vitamin D, Ergocalciferol, (DRISDOL) 50000 UNITS CAPS capsule Take 50,000 Units by mouth every 7 (seven) days.       No current facility-administered medications for this visit.    Past Medical History  Diagnosis Date  . Essential hypertension, benign   . Cataract of right eye   . Recurrent UTI   . Osteoporosis   . Mixed hyperlipidemia   . History of cardiac catheterization     Reportedly no significant CAD 1998 - previously followed with Dr. Corinda Gubler    Past Surgical History  Procedure Laterality Date  . Lipoma excision      Back  . Abdominal hysterectomy    . Foot surgery    . Cataract extraction w/phaco  09/17/2011    Procedure: CATARACT EXTRACTION PHACO AND INTRAOCULAR LENS PLACEMENT (IOC);  Surgeon: Gemma Payor;  Location: [facility];  Service: Ophthalmology;  Laterality: Right;  CDE:23.52  . Cystoscopy      Family History  Problem Relation Age of Onset  . Stroke Mother   . Stroke Brother   . Hypertension Mother   .  Hypertension Brother   . Diabetes Brother   . Cancer Father     Lung/Lymphoma  . Cancer Mother     Lymphoma    Social History Ms. Voller reports that she has quit smoking. Her smoking use included Cigarettes. She smoked 0.00 packs per day. She does not have any smokeless tobacco history on file. Ms. Chaudry reports that she does not drink alcohol.  Review of Systems Negative except as outlined above.  Physical Examination Filed Vitals:   08/13/14 1627  BP: 150/90  Pulse: 70                   `  Normally nourished appearing woman in no acute distress. HEENT: Conjunctiva and lids normal, oropharynx clear. Neck: Supple, no elevated JVP or carotid bruits, no thyromegaly. Lungs: Clear to auscultation, nonlabored breathing at rest. Cardiac: Regular rate and rhythm, no S3  No murmur  no pericardial rub. Abdomen: Soft, nontender, bowel sounds present, no guarding or rebound. Extremities: No pitting edema, distal pulses 2+. Skin: Warm and dry. Musculoskeletal: No kyphosis. Neuropsychiatric: Alert and oriented x3, affect grossly appropriate.  EKG  NSR 70  Nonspecific ST T wave changes   Problem List and Plan  1.  HTN  BP is a little high today.  SHe says it is usually better.  WOuld continue current regimen  F/U with [facility]  2. CAD  Mild No symptoms of angina 2.  HL  LDL is over 160  SHe did not tolearte Crestor or Lipitor or Simvistatin.  WIll try Livalo 2 mg 3x per wk  She is receiving Vit D supplements from Dr Margo AyeHall F?U lipid panel in 8 wk

## 2014-08-13 ENCOUNTER — Encounter: Payer: Self-pay | Admitting: Internal Medicine

## 2014-08-13 ENCOUNTER — Ambulatory Visit (INDEPENDENT_AMBULATORY_CARE_PROVIDER_SITE_OTHER): Payer: Medicare Other | Admitting: Internal Medicine

## 2014-08-13 VITALS — BP 150/90 | HR 70 | Wt 137.0 lb

## 2014-08-13 DIAGNOSIS — I10 Essential (primary) hypertension: Secondary | ICD-10-CM

## 2014-08-13 NOTE — Patient Instructions (Addendum)
Your physician wants you to follow-up in: YEAR WITH DR Filbert Schilder will receive a reminder letter in the mail two months in advance. If you don't receive a letter, please call our office to schedule the follow-up appointment.  Your physician has recommended you make the following change in your medication: LIVALO  2MG   3 X  WEEK Your physician recommends that you return for lab work in:  2 MONTHS   VIT  D

## 2014-08-25 ENCOUNTER — Encounter: Payer: Self-pay | Admitting: Internal Medicine

## 2014-09-15 ENCOUNTER — Emergency Department (HOSPITAL_COMMUNITY)
Admission: EM | Admit: 2014-09-15 | Discharge: 2014-09-16 | Disposition: A | Discharge status: Home / Self Care | Payer: Medicare Other | Attending: Emergency Medicine | Admitting: Emergency Medicine

## 2014-09-15 ENCOUNTER — Emergency Department (HOSPITAL_COMMUNITY): Payer: Medicare Other

## 2014-09-15 ENCOUNTER — Encounter (HOSPITAL_COMMUNITY): Payer: Self-pay

## 2014-09-15 DIAGNOSIS — S99911A Unspecified injury of right ankle, initial encounter: Secondary | ICD-10-CM | POA: Diagnosis present

## 2014-09-15 DIAGNOSIS — Y9389 Activity, other specified: Secondary | ICD-10-CM | POA: Diagnosis not present

## 2014-09-15 DIAGNOSIS — S93401A Sprain of unspecified ligament of right ankle, initial encounter: Secondary | ICD-10-CM

## 2014-09-15 DIAGNOSIS — I10 Essential (primary) hypertension: Secondary | ICD-10-CM | POA: Diagnosis not present

## 2014-09-15 DIAGNOSIS — S82892A Other fracture of left lower leg, initial encounter for closed fracture: Secondary | ICD-10-CM | POA: Diagnosis not present

## 2014-09-15 DIAGNOSIS — Z87440 Personal history of urinary (tract) infections: Secondary | ICD-10-CM | POA: Insufficient documentation

## 2014-09-15 DIAGNOSIS — Y998 Other external cause status: Secondary | ICD-10-CM | POA: Insufficient documentation

## 2014-09-15 DIAGNOSIS — W010XXA Fall on same level from slipping, tripping and stumbling without subsequent striking against object, initial encounter: Secondary | ICD-10-CM | POA: Insufficient documentation

## 2014-09-15 DIAGNOSIS — Z8739 Personal history of other diseases of the musculoskeletal system and connective tissue: Secondary | ICD-10-CM | POA: Insufficient documentation

## 2014-09-15 DIAGNOSIS — Z8639 Personal history of other endocrine, nutritional and metabolic disease: Secondary | ICD-10-CM | POA: Insufficient documentation

## 2014-09-15 DIAGNOSIS — Z7982 Long term (current) use of aspirin: Secondary | ICD-10-CM | POA: Insufficient documentation

## 2014-09-15 DIAGNOSIS — Z8669 Personal history of other diseases of the nervous system and sense organs: Secondary | ICD-10-CM | POA: Insufficient documentation

## 2014-09-15 DIAGNOSIS — Y92019 Unspecified place in single-family (private) house as the place of occurrence of the external cause: Secondary | ICD-10-CM | POA: Insufficient documentation

## 2014-09-15 DIAGNOSIS — Z792 Long term (current) use of antibiotics: Secondary | ICD-10-CM | POA: Insufficient documentation

## 2014-09-15 DIAGNOSIS — Z9889 Other specified postprocedural states: Secondary | ICD-10-CM | POA: Insufficient documentation

## 2014-09-15 DIAGNOSIS — Z87891 Personal history of nicotine dependence: Secondary | ICD-10-CM | POA: Insufficient documentation

## 2014-09-15 DIAGNOSIS — Y92009 Unspecified place in unspecified non-institutional (private) residence as the place of occurrence of the external cause: Secondary | ICD-10-CM

## 2014-09-15 DIAGNOSIS — T1490XA Injury, unspecified, initial encounter: Secondary | ICD-10-CM

## 2014-09-15 DIAGNOSIS — W19XXXA Unspecified fall, initial encounter: Secondary | ICD-10-CM

## 2014-09-15 NOTE — ED Notes (Signed)
Pt states she twisted her left ankle on a step as she went out the door, states she fell onto the patio and then she also injured her right ankle, bilat swelling noted to both ankles.

## 2014-09-15 NOTE — ED Provider Notes (Signed)
CSN: [id number]     Arrival date & time 09/15/14  2259 History   First MD Initiated Contact with Patient 09/15/14 2350     Chief Complaint  Patient presents with  . Ankle Injury     (Consider location/radiation/quality/duration/timing/severity/associated sxs/prior Treatment) HPI Comments: Patient is a 68 year old female who presents to the emergency department with a complaint of right and left ankle pain. The patient states that she was outside working when she slipped, fell, and twisted her left ankle. She also injured the right ankle, and is unsure how she did that with exception of possibly hitting one of the steps while she was outside. She denies hitting her head. She denies neck injury. She denies chest and torso injury. She denies pelvis injury, she states she has a bad back, but nothing new going on with her back. She did not have any loss of consciousness. There was no loss of bowel or bladder during the fall. She presents now for assistance with this particular problem.  Patient is a 68 y.o. female presenting with lower extremity injury. The history is provided by the patient.  Ankle Injury This is a new problem. The current episode started today. Associated symptoms include arthralgias. Pertinent negatives include no abdominal pain, chest pain, coughing or neck pain.    Past Medical History  Diagnosis Date  . Essential hypertension, benign   . Cataract of right eye   . Recurrent UTI   . Osteoporosis   . Mixed hyperlipidemia   . History of cardiac catheterization     Reportedly no significant CAD 1998 - previously followed with Dr. Corinda Gubler   Past Surgical History  Procedure Laterality Date  . Lipoma excision      Back  . Abdominal hysterectomy    . Foot surgery    . Cataract extraction w/phaco  09/17/2011    Procedure: CATARACT EXTRACTION PHACO AND INTRAOCULAR LENS PLACEMENT (IOC);  Surgeon: Gemma Payor;  Location: [facility];  Service: Ophthalmology;  Laterality: Right;   CDE:23.52  . Cystoscopy     Family History  Problem Relation Age of Onset  . Stroke Mother   . Stroke Brother   . Hypertension Mother   . Hypertension Brother   . Diabetes Brother   . Cancer Father     Lung/Lymphoma  . Cancer Mother     Lymphoma   History  Substance Use Topics  . Smoking status: Former Smoker    Types: Cigarettes  . Smokeless tobacco: Not on file  . Alcohol Use: No   OB History    No data available     Review of Systems  Constitutional: Negative for activity change.       All ROS Neg except as noted in HPI  HENT: Negative for nosebleeds.   Eyes: Negative for photophobia and discharge.  Respiratory: Negative for cough, shortness of breath and wheezing.   Cardiovascular: Negative for chest pain and palpitations.  Gastrointestinal: Negative for abdominal pain and blood in stool.  Genitourinary: Negative for dysuria, frequency and hematuria.  Musculoskeletal: Positive for arthralgias. Negative for back pain and neck pain.  Skin: Negative.   Neurological: Negative for dizziness, seizures and speech difficulty.  Psychiatric/Behavioral: Negative for hallucinations and confusion.      Allergies  Eggs or egg-derived products; Milk-related compounds; and Sulfa antibiotics  Home Medications   Prior to Admission medications   Medication Sig Start Date End Date Taking? Authorizing Provider  aspirin 81 MG tablet Take 81 mg by mouth daily.  Yes Historical Provider, MD  Chlorphen-PE-Acetaminophen (NOREL AD) 4-10-325 MG TABS Take 1 tablet by mouth daily as needed. sinus    Yes Historical Provider, MD  nitrofurantoin (MACRODANTIN) 50 MG capsule Take 50 mg by mouth daily.     Yes Historical Provider, MD  Vitamin D, Ergocalciferol, (DRISDOL) 50000 UNITS CAPS capsule Take 50,000 Units by mouth every 7 (seven) days.   Yes Historical Provider, MD   BP 188/81 mmHg  Pulse 88  Temp(Src) 98.2 F (36.8 C) (Oral)  Resp 16  Ht 5\' 4"  (1.626 m)  Wt 137 lb (62.143 kg)   BMI 23.50 kg/m2  SpO2 100% Physical Exam  Constitutional: She is oriented to person, place, and time. She appears well-developed and well-nourished.  Non-toxic appearance.  HENT:  Head: Normocephalic.  Right Ear: Tympanic membrane and external ear normal.  Left Ear: Tympanic membrane and external ear normal.  Eyes: EOM and lids are normal. Pupils are equal, round, and reactive to light.  Neck: Normal range of motion. Neck supple. Carotid bruit is not present.  Cardiovascular: Normal rate, regular rhythm, normal heart sounds, intact distal pulses and normal pulses.   Pulmonary/Chest: Breath sounds normal. No respiratory distress.  No chest wall pain, no deformity of the ribs, symmetrical rise and fall of the chest. The patient speaks in complete sentences without problem.  Abdominal: Soft. Bowel sounds are normal. She exhibits no distension. There is no tenderness. There is no guarding.  Musculoskeletal: Normal range of motion.  There is no palpable step off of the cervical, thoracic, or lumbar spine. There is no pain of the pelvis with movement of the pelvic area. There is no fractional shortening appreciated. There is good range of motion of the knees and hips, but with some crepitus. There is swelling of the right ankle, more at the lateral malleolus. The dorsalis pedis pulses 2+, and the capillary refill is less than 2 seconds. There is swelling and tenderness of the left ankle. There is both medial and lateral pain. The Achilles tendon is intact bilaterally. The dorsalis pedis pulses 2+ and the capillary refill is less than 2 seconds. Abrasion of the right tibia area, no deformity.  Lymphadenopathy:       Head (right side): No submandibular adenopathy present.       Head (left side): No submandibular adenopathy present.    She has no cervical adenopathy.  Neurological: She is alert and oriented to person, place, and time. She has normal strength. No cranial nerve deficit or sensory deficit.   Skin: Skin is warm and dry.  Psychiatric: She has a normal mood and affect. Her speech is normal.  Nursing note and vitals reviewed.   ED Course  Procedures (including critical care time) Labs Review Labs Reviewed - No data to display  Imaging Review Dg Ankle Complete Left  09/15/2014   CLINICAL DATA:  Rolled left ankle causing fall.  Initial encounter  EXAM: LEFT ANKLE COMPLETE - 3+ VIEW  COMPARISON:  None.  FINDINGS: Lucency through the base of the medial malleolus, which likely represents a nondisplaced fracture. There is minimal cortical irregularity involving the lateral malleolus, without definite fracture line. Soft tissue swelling which is marked laterally. Ankle joint effusion is present.  IMPRESSION: 1. Suspect a nondisplaced fracture of the medial malleolus. CT could confirm if clinically needed. 2. Soft tissue swelling and ankle joint effusion.   Electronically Signed   By: Tiburcio PeaJonathan  Watts M.D.   On: 09/15/2014 23:52   Dg Ankle Complete Right  09/15/2014   CLINICAL DATA:  Rolled left ankle with fall. Bilateral ankle pain. Initial encounter  EXAM: RIGHT ANKLE - COMPLETE 3+ VIEW  COMPARISON:  None.  FINDINGS: Prominent lateral soft tissue swelling with ankle joint effusion. No acute fracture or malalignment.  IMPRESSION: Soft tissue swelling and ankle joint effusion.  No acute fracture.   Electronically Signed   By: Tiburcio Pea M.D.   On: 09/15/2014 23:48     EKG Interpretation None      MDM  Vital signs are well within normal limits with exception of the blood pressure being elevated at 188/81.  X-ray of the left ankle questions a lucency through the base of the medial malleolus, which is felt to be a nondisplaced fracture. X-ray of the right ankle shows no fracture or dislocation.  Plan at this time is for the patient to be fitted with a short leg splint on the left and a ankle stirrup splint on the right. Patient is fitted for crutches. The patient is encouraged to  obtain a walker as soon as possible. Patient is encouraged to keep both ankles elevated above her waist. The patient desires to see Dr. Romeo Apple she is seen him on the previous occasion, and the patient will be referred to Dr. Romeo Apple for orthopedic evaluation. Prescription for Norco one or 2 every 4 hours as needed for pain is given to the patient.   Pt told Dr Gardiner Rhyme she had pain of the right tibia area. Xray is negative. Pt can be dischaged.   Final diagnoses:  Injury    **I have reviewed nursing notes, vital signs, and all appropriate lab and imaging results for this patient.Kathie Dike, PA-C 09/16/14 0022  Kathie Dike, PA-C 09/16/14 0140  Enid Skeens, MD 09/16/14 [phone]

## 2014-09-16 ENCOUNTER — Emergency Department (HOSPITAL_COMMUNITY): Payer: Medicare Other

## 2014-09-16 MED ORDER — HYDROCODONE-ACETAMINOPHEN 5-325 MG PO TABS: ORAL_TABLET | ORAL | Status: DC

## 2014-09-16 MED ORDER — IBUPROFEN 800 MG PO TABS
800.0000 mg | ORAL_TABLET | Freq: Once | ORAL | Status: DC
  Administered 2014-09-16: 800 mg via ORAL
  Filled 2014-09-16: qty 1

## 2014-09-16 MED ORDER — HYDROCODONE-ACETAMINOPHEN 5-325 MG PO TABS: 1.0000 | ORAL_TABLET | ORAL | Status: DC | PRN

## 2014-09-16 NOTE — Discharge Instructions (Signed)
Ankle Fracture PLEASE SEE DR. HARRISON IN  THE OFFICE.     kEEP BOTH ANKLES ELEVATED.                          A fracture is a break in a bone. A cast or splint may be used to protect the ankle and heal the break. Sometimes, surgery is needed. HOME CARE  Use crutches as told by your doctor. It is very important that you use your crutches correctly.  Do not put weight or pressure on the injured ankle until told by your doctor.  Keep your ankle raised (elevated) when sitting or lying down.  Apply ice to the ankle:  Put ice in a plastic bag.  Place a towel between your cast and the bag.  Leave the ice on for 20 minutes, 2-3 times a day.  If you have a plaster or fiberglass cast:  Do not try to scratch under the cast with any objects.  Check the skin around the cast every day. You may put lotion on red or sore areas.  Keep your cast dry and clean.  If you have a plaster splint:  Wear the splint as told by your doctor.  You can loosen the elastic around the splint if your toes get numb, tingle, or turn cold or blue.  Do not put pressure on any part of your cast or splint. It may break. Rest your plaster splint or cast only on a pillow the first 24 hours until it is fully hardened.  Cover your cast or splint with a plastic bag during showers.  Do not lower your cast or splint into water.  Take medicine as told by your doctor.  Do not drive until your doctor says it is safe.  Follow-up with your doctor as told. It is very important that you go to your follow-up visits. GET HELP IF: The swelling and discomfort gets worse.  GET HELP RIGHT AWAY IF:   Your splint or cast breaks.  You continue to have very bad pain.  You have new pain or swelling after your splint or cast was put on.  Your skin or toes below the injured ankle:  Turn blue or gray.  Feel cold, numb, or you cannot feel them.  There is a bad smell or yellowish white fluid (pus) coming from under the splint  or cast. MAKE SURE YOU:   Understand these instructions.  Will watch your condition.  Will get help right away if you are not doing well or get worse. Document Released: 08/05/2009 Document Revised: 07/29/2013 Document Reviewed: 05/07/2013 [facility] Patient Information 2015 [place], [place]. This information is not intended to replace advice given to you by your health care provider. Make sure you discuss any questions you have with your health care provider.

## 2014-09-20 ENCOUNTER — Ambulatory Visit (INDEPENDENT_AMBULATORY_CARE_PROVIDER_SITE_OTHER): Payer: Medicare Other | Admitting: Orthopedic Surgery

## 2014-09-20 ENCOUNTER — Encounter: Payer: Self-pay | Admitting: Orthopedic Surgery

## 2014-09-20 VITALS — BP 129/83 | Ht 64.0 in | Wt 137.0 lb

## 2014-09-20 DIAGNOSIS — S93401A Sprain of unspecified ligament of right ankle, initial encounter: Secondary | ICD-10-CM

## 2014-09-20 DIAGNOSIS — S93409A Sprain of unspecified ligament of unspecified ankle, initial encounter: Secondary | ICD-10-CM | POA: Insufficient documentation

## 2014-09-20 DIAGNOSIS — S8252XA Displaced fracture of medial malleolus of left tibia, initial encounter for closed fracture: Secondary | ICD-10-CM

## 2014-09-20 DIAGNOSIS — S8253XA Displaced fracture of medial malleolus of unspecified tibia, initial encounter for closed fracture: Secondary | ICD-10-CM | POA: Insufficient documentation

## 2014-09-20 MED ORDER — HYDROCODONE-ACETAMINOPHEN 5-325 MG PO TABS: 1.0000 | ORAL_TABLET | ORAL | Status: DC | PRN

## 2014-09-20 NOTE — Patient Instructions (Signed)
WBAT IN CAM WALKER AND ASO BRACE  REMOVE FOR BATHING

## 2014-09-20 NOTE — Progress Notes (Signed)
Patient ID: [name], female   DOB: [birth year], 68 y.o.   MRN: [id number]   Chief Complaint  Patient presents with  . Ankle Injury    left ankle injury, possible fracture,DOI 09/15/14    HPI [name] is a 68 y.o. female.  Larey SeatFell out of the doorway of her home on 09/15/2014. Complains of pain swelling stiffness left and right ankle. Rates her pain 6 out of 10. Current medication hydrocodone. Previous evaluation x-ray at the emergency room she is a nondisplaced fracture medial malleolus left ankle, ankle sprain right. Emergency room records reviewed incorporated by reference.   HPI  Past Medical History  Diagnosis Date  . Essential hypertension, benign   . Cataract of right eye   . Recurrent UTI   . Osteoporosis   . Mixed hyperlipidemia   . History of cardiac catheterization     Reportedly no significant CAD 1998 - previously followed with Dr. Corinda GublerLeBauer    Past Surgical History  Procedure Laterality Date  . Lipoma excision      Back  . Abdominal hysterectomy    . Foot surgery    . Cataract extraction w/phaco  09/17/2011    Procedure: CATARACT EXTRACTION PHACO AND INTRAOCULAR LENS PLACEMENT (IOC);  Surgeon: Gemma PayorKerry Hunt;  Location: [facility];  Service: Ophthalmology;  Laterality: Right;  CDE:23.52  . Cystoscopy      Family History  Problem Relation Age of Onset  . Stroke Mother   . Stroke Brother   . Hypertension Mother   . Hypertension Brother   . Diabetes Brother   . Cancer Father     Lung/Lymphoma  . Cancer Mother     Lymphoma    Social History History  Substance Use Topics  . Smoking status: Former Smoker    Types: Cigarettes  . Smokeless tobacco: Not on file  . Alcohol Use: No    Allergies  Allergen Reactions  . Eggs Or Egg-Derived Products Diarrhea  . Milk-Related Compounds Diarrhea  . Sulfa Antibiotics     Current Outpatient Prescriptions  Medication Sig Dispense Refill  . aspirin 81 MG tablet Take 81 mg by mouth daily.    .  Chlorphen-PE-Acetaminophen (NOREL AD) 4-10-325 MG TABS Take 1 tablet by mouth daily as needed. sinus     . HYDROcodone-acetaminophen (NORCO/VICODIN) 5-325 MG per tablet Take 1 tablet by mouth every 4 (four) hours as needed. 15 tablet 0  . nitrofurantoin (MACRODANTIN) 50 MG capsule Take 100 mg by mouth daily.     . Vitamin D, Ergocalciferol, (DRISDOL) 50000 UNITS CAPS capsule Take 50,000 Units by mouth every 7 (seven) days.    Marland Kitchen. HYDROcodone-acetaminophen (NORCO/VICODIN) 5-325 MG per tablet 1 or 2 po q4h prn pain (Patient not taking: Reported on 09/20/2014) 6 tablet 0   No current facility-administered medications for this visit.    Review of Systems Review of Systems Seasonal allergies and otherwise normal review of systems Blood pressure 129/83, height 5\' 4"  (1.626 m), weight 137 lb (62.143 kg).  Physical Exam Physical Exam  Gen. appearance is normal The patient is alert and oriented person place and time Mood is normal affect is normal Ambulatory status abnormal, limping bilaterally  Starting with the right ankle. She has tenderness and swelling from the tibia distally to the ankle and foot with ecchymosis in the skin no loss of motor function or atrophy. Knee and ankle stable with decreased range of motion at the ankle joint and palpable tenderness medially and laterally along the ankle ligaments.  Normal pulse and perfusion and sensation are noted.  On the left side we have more exquisite tenderness on the medial malleolus and tenderness in the lateral ankle ligaments with ecchymosis noted around the foot and ankle and decreased range of motion but stability tests are normal. Motor function is normal. Skin ecchymotic but intact. Pulses good sensation normal.   Data Reviewed Emergency room records Right tibial x-rays I interpreted as normal Right ankle x-rays I interpreted normal Left ankle x-rays I interpreted as nondisplaced medial malleolus fracture  Assessment    Encounter  Diagnoses  Name Primary?  . Fractured medial malleolus, left, closed, initial encounter Yes  . Ankle sprain, right, initial encounter         Plan    Cam Walker left foot ASO brace right foot We gave her 90 Norco 5 mg tablets 1 every 4 hours when necessary pain  X-ray in 6 weeks left ankle for medial malleolus fracture  Fracture care added.       Fuller Canada 09/20/2014, 9:49 AM

## 2014-09-24 MED FILL — Hydrocodone-Acetaminophen Tab 5-325 MG: ORAL | Qty: 6 | Status: AC

## 2014-09-30 ENCOUNTER — Telehealth: Payer: Self-pay | Admitting: Orthopedic Surgery

## 2014-09-30 NOTE — Telephone Encounter (Signed)
Can patient get a temporary parking placard, related to ankle fracture, left, and ankle sprain, right; states Dr Romeo Apple said she can return to driving when she is able to be more mobile. Form in Dr's box.  Patient ph# is [phone]

## 2014-09-30 NOTE — Telephone Encounter (Signed)
Yes

## 2014-10-01 NOTE — Telephone Encounter (Signed)
Called patient, notified.

## 2014-11-02 ENCOUNTER — Ambulatory Visit: Payer: Medicare Other | Admitting: Orthopedic Surgery

## 2014-11-23 ENCOUNTER — Ambulatory Visit (INDEPENDENT_AMBULATORY_CARE_PROVIDER_SITE_OTHER): Payer: Medicare Other

## 2014-11-23 ENCOUNTER — Ambulatory Visit (INDEPENDENT_AMBULATORY_CARE_PROVIDER_SITE_OTHER): Payer: Self-pay | Admitting: Orthopedic Surgery

## 2014-11-23 VITALS — BP 95/56 | Ht 64.0 in | Wt 137.0 lb

## 2014-11-23 DIAGNOSIS — S82892A Other fracture of left lower leg, initial encounter for closed fracture: Secondary | ICD-10-CM

## 2014-11-23 DIAGNOSIS — S8252XA Displaced fracture of medial malleolus of left tibia, initial encounter for closed fracture: Secondary | ICD-10-CM

## 2014-11-23 NOTE — Patient Instructions (Signed)
Remove brace resume activities as tolerated on a gradual basis

## 2014-11-24 ENCOUNTER — Encounter: Payer: Self-pay | Admitting: Orthopedic Surgery

## 2014-11-24 NOTE — Progress Notes (Signed)
Patient ID: Caitlin AIME, female   DOB: Feb 12, 1946, 69 y.o.   MRN: [id number] Chief Complaint  Patient presents with  . Follow-up    follow up and xray Left ankle fx, DOI 09/15/14    BP 95/56 mmHg  Ht 5\' 4"  (1.626 m)  Wt 137 lb (62.143 kg)  BMI 23.50 kg/m2  Plane films today show fracture healing mortise intact  Clinical exam the patient is nontender  She can wean herself from her braces and follow-up with [place] as needed

## 2015-06-14 ENCOUNTER — Encounter: Payer: Self-pay | Admitting: Internal Medicine

## 2015-06-29 ENCOUNTER — Telehealth: Payer: Self-pay | Admitting: Internal Medicine

## 2015-06-29 NOTE — Telephone Encounter (Signed)
New message      Returning a nurses call.  Pt received a letter stating we are trying to reach her.  Please call after 3:30

## 2015-06-29 NOTE — Telephone Encounter (Signed)
Agree with plan 

## 2015-06-29 NOTE — Telephone Encounter (Signed)
We had been trying to reach patient regarding lab work. LDL elevated.--She reports has tried every cholesterol medicine there is and cannot tolerate them.   After much discussion, she is agreeable to try the lipid clinic for suggestions.  She has appointment 09/02/15 with Dr. Tenny Craw and would like to be scheduled with lipid clinic same day, prior to dr Tenny Craw appointment if possible.  Advised I will send message to scheduler to arrange.  She is appreciative for the information.

## 2015-09-02 ENCOUNTER — Encounter: Payer: Self-pay | Admitting: Internal Medicine

## 2015-09-02 ENCOUNTER — Ambulatory Visit (INDEPENDENT_AMBULATORY_CARE_PROVIDER_SITE_OTHER): Payer: Medicare Other | Admitting: Internal Medicine

## 2015-09-02 ENCOUNTER — Ambulatory Visit: Payer: Medicare Other | Admitting: Pharmacist

## 2015-09-02 VITALS — BP 138/90 | HR 82 | Ht 64.0 in | Wt 138.0 lb

## 2015-09-02 DIAGNOSIS — I2510 Atherosclerotic heart disease of native coronary artery without angina pectoris: Secondary | ICD-10-CM

## 2015-09-02 DIAGNOSIS — R0989 Other specified symptoms and signs involving the circulatory and respiratory systems: Secondary | ICD-10-CM

## 2015-09-02 NOTE — Patient Instructions (Signed)
Medication Instructions:   NO CHANGE  Testing/Procedures:  Your physician has requested that you have a carotid duplex. This test is an ultrasound of the carotid arteries in your neck. It looks at blood flow through these arteries that supply the brain with blood. Allow one hour for this exam. There are no restrictions or special instructions.    Follow-Up:   REFERRAL TO LIPID CLINIC   Your physician wants you to follow-up in: ONE YEAR WITH DR Filbert Schilder will receive a reminder letter in the mail two months in advance. If you don't receive a letter, please call our office to schedule the follow-up appointment.   If you need a refill on your cardiac medications before your next appointment, please call your pharmacy.

## 2015-09-02 NOTE — Progress Notes (Signed)
Cardiology Office Note   Date:  09/02/2015   ID:  [name], [name] 16, 1947, MRN [id number]  PCP:  Dwana Melena, MD  Cardiologist:   Dietrich Pates, MD   Chief Complaint  Patient presents with  . Hypertension      History of Present Illness: [name] is a 69 y.o. female with a history of nonobstructive CAD by cath, HTN, HL  She was seen by Daphane Shepherd in past  I saw her last year.    Since seen she has done OK  No CP  Breathing is OK No dizziness    Current Outpatient Prescriptions  Medication Sig Dispense Refill  . aspirin 81 MG tablet Take 81 mg by mouth daily.    . Chlorphen-PE-Acetaminophen (NOREL AD) 4-10-325 MG TABS Take 1 tablet by mouth daily as needed. sinus     . HYDROcodone-acetaminophen (NORCO/VICODIN) 5-325 MG per tablet Take 1 tablet by mouth every 4 (four) hours as needed. 90 tablet 0  . nitrofurantoin (MACRODANTIN) 50 MG capsule Take 100 mg by mouth daily.     Marland Kitchen trimethoprim (TRIMPEX) 100 MG tablet Take 100 mg by mouth daily. Take one-half tablet daily  10  . Vitamin D, Cholecalciferol, 1000 UNITS CAPS Take 2 capsules by mouth daily. 2000 units daily     No current facility-administered medications for this visit.    Allergies:   Eggs or egg-derived products; Milk-related compounds; and Sulfa antibiotics   Past Medical History  Diagnosis Date  . Essential hypertension, benign   . Cataract of right eye   . Recurrent UTI   . Osteoporosis   . Mixed hyperlipidemia   . History of cardiac catheterization     Reportedly no significant CAD 1998 - previously followed with Dr. Corinda Gubler    Past Surgical History  Procedure Laterality Date  . Lipoma excision      Back  . Abdominal hysterectomy    . Foot surgery    . Cataract extraction w/phaco  09/17/2011    Procedure: CATARACT EXTRACTION PHACO AND INTRAOCULAR LENS PLACEMENT (IOC);  Surgeon: Gemma Payor;  Location: [facility];  Service: Ophthalmology;  Laterality: Right;  CDE:23.52  . Cystoscopy        Social History:  The patient  reports that she has quit smoking. Her smoking use included Cigarettes. She does not have any smokeless tobacco history on file. She reports that she does not drink alcohol or use illicit drugs.   Family History:  The patient's family history includes Cancer in her father and mother; Diabetes in her brother; Hypertension in her brother and mother; Stroke in her brother and mother.    ROS:  Please see the history of present illness. All other systems are reviewed and  Negative to the above problem except as noted.    PHYSICAL EXAM: VS:  BP 138/90 mmHg  Pulse 82  Ht  (1.626 m)  Wt 62.596 kg (138 lb)  BMI 23.68 kg/m2  GEN: Well nourished, well developed, in no acute distress HEENT: normal Neck: no JVD, carotid bruits, or masses Cardiac: RRR; no murmurs, rubs, or gallops,no edema  Respiratory:  clear to auscultation bilaterally, normal work of breathing GI: soft, nontender, nondistended, + BS  No hepatomegaly  MS: no deformity Moving all extremities   Skin: warm and dry, no rash Neuro:  Strength and sensation are intact Psych: euthymic mood, full affect   EKG:  EKG is ordered today.  SR  82  BPm  LAD  Lipid Panel No results found for: CHOL, TRIG, HDL, CHOLHDL, VLDL, LDLCALC, LDLDIRECT    Wt Readings from Last 3 Encounters:  09/02/15 62.596 kg (138 lb)  11/23/14 62.143 kg (137 lb)  09/20/14 62.143 kg (137 lb)      ASSESSMENT AND PLAN:  1  HTn  Fair control  Not on anything.  Follow  2  CAD  No symptoms of angina  FOllow  Continue ASA Check CBC  3  HL   Gave info on PCSK9 inhib  Will refer for pharmacy to evaluate for drug options  Could not tolearte statins due to aches Chec carotid USN given profound hyperlipidemia     Signed, Dietrich Pates, MD  09/02/2015 4:35 PM    [facility] [address] [place], [place], [place]  [address] Phone: [phone]; Fax: [phone]

## 2015-09-06 ENCOUNTER — Ambulatory Visit (HOSPITAL_COMMUNITY)
Admission: RE | Admit: 2015-09-06 | Discharge: 2015-09-06 | Disposition: A | Discharge status: Home / Self Care | Payer: Medicare Other | Source: Ambulatory Visit | Attending: Internal Medicine | Admitting: Internal Medicine

## 2015-09-06 ENCOUNTER — Ambulatory Visit: Payer: Medicare Other | Admitting: Pharmacist

## 2015-09-06 DIAGNOSIS — E782 Mixed hyperlipidemia: Secondary | ICD-10-CM | POA: Insufficient documentation

## 2015-09-06 DIAGNOSIS — I10 Essential (primary) hypertension: Secondary | ICD-10-CM | POA: Insufficient documentation

## 2015-09-06 DIAGNOSIS — I6523 Occlusion and stenosis of bilateral carotid arteries: Secondary | ICD-10-CM | POA: Diagnosis not present

## 2015-09-06 DIAGNOSIS — I708 Atherosclerosis of other arteries: Secondary | ICD-10-CM | POA: Insufficient documentation

## 2015-09-06 DIAGNOSIS — R0989 Other specified symptoms and signs involving the circulatory and respiratory systems: Secondary | ICD-10-CM | POA: Diagnosis not present

## 2015-09-13 ENCOUNTER — Encounter: Payer: Self-pay | Admitting: *Deleted

## 2015-09-21 ENCOUNTER — Telehealth: Payer: Self-pay | Admitting: Internal Medicine

## 2015-09-21 ENCOUNTER — Telehealth: Payer: Self-pay | Admitting: *Deleted

## 2015-09-21 NOTE — Telephone Encounter (Signed)
F/u   Pt returning call per letter she was sent.

## 2015-09-21 NOTE — Telephone Encounter (Signed)
This appears to have been addressed.  See additional phone notes 09/21/15.

## 2015-09-21 NOTE — Telephone Encounter (Signed)
Mailed results of carotid study with Dr. Charlott Rakes note for no planned routine follow up of carotids.

## 2015-09-21 NOTE — Telephone Encounter (Signed)
Patient returned call about [place] results.  I advised that the results have been sent to her in the mail.  Patient asked for the results.  Advised of the results and Dr Tenny Craw' comments.

## 2015-09-21 NOTE — Telephone Encounter (Signed)
F/u  Pt received letter that her results were ready. Pt requested that the information/results be sent to her in the mail- did not need a call back.

## 2016-06-14 DIAGNOSIS — E559 Vitamin D deficiency, unspecified: Secondary | ICD-10-CM | POA: Diagnosis not present

## 2016-06-14 DIAGNOSIS — E782 Mixed hyperlipidemia: Secondary | ICD-10-CM | POA: Diagnosis not present

## 2016-06-14 DIAGNOSIS — D509 Iron deficiency anemia, unspecified: Secondary | ICD-10-CM | POA: Diagnosis not present

## 2016-06-18 DIAGNOSIS — R319 Hematuria, unspecified: Secondary | ICD-10-CM | POA: Diagnosis not present

## 2016-06-18 DIAGNOSIS — E784 Other hyperlipidemia: Secondary | ICD-10-CM | POA: Diagnosis not present

## 2016-06-18 DIAGNOSIS — J449 Chronic obstructive pulmonary disease, unspecified: Secondary | ICD-10-CM | POA: Diagnosis not present

## 2016-06-18 DIAGNOSIS — I10 Essential (primary) hypertension: Secondary | ICD-10-CM | POA: Diagnosis not present

## 2016-06-18 DIAGNOSIS — H8110 Benign paroxysmal vertigo, unspecified ear: Secondary | ICD-10-CM | POA: Diagnosis not present

## 2016-06-18 DIAGNOSIS — E559 Vitamin D deficiency, unspecified: Secondary | ICD-10-CM | POA: Diagnosis not present

## 2016-06-18 DIAGNOSIS — I2510 Atherosclerotic heart disease of native coronary artery without angina pectoris: Secondary | ICD-10-CM | POA: Diagnosis not present

## 2016-06-18 DIAGNOSIS — D509 Iron deficiency anemia, unspecified: Secondary | ICD-10-CM | POA: Diagnosis not present

## 2016-06-18 DIAGNOSIS — N184 Chronic kidney disease, stage 4 (severe): Secondary | ICD-10-CM | POA: Diagnosis not present

## 2016-07-12 DIAGNOSIS — N390 Urinary tract infection, site not specified: Secondary | ICD-10-CM | POA: Diagnosis not present

## 2016-07-12 DIAGNOSIS — R319 Hematuria, unspecified: Secondary | ICD-10-CM | POA: Diagnosis not present

## 2016-09-10 ENCOUNTER — Ambulatory Visit: Payer: Medicare Other | Admitting: Internal Medicine

## 2016-09-24 DIAGNOSIS — H35372 Puckering of macula, left eye: Secondary | ICD-10-CM | POA: Diagnosis not present

## 2016-09-24 DIAGNOSIS — H26491 Other secondary cataract, right eye: Secondary | ICD-10-CM | POA: Diagnosis not present

## 2017-03-21 NOTE — Progress Notes (Signed)
Cardiology Office Note   Date:  03/22/2017   ID:  Mirha, Brucato [birth year], MRN [id number]  PCP:  Benita Stabile, MD  Cardiologist:   Dietrich Pates, MD   F/U of CAD  History of Present Illness: [name] is a 71 y.o. female with a history of nonobstructive CAD, HTN and HL  I saw her in 2016 Pt says her breathig is pretty good  Hx allergies   No chest discomfort    Occasionally has calf cramps  Usually after yard 2work   LDL was 156 in Aug 2017     Current Meds  Medication Sig  . aspirin 81 MG tablet Take 81 mg by mouth daily.  . Chlorphen-PE-Acetaminophen (NOREL AD) 4-10-325 MG TABS Take 1 tablet by mouth daily as needed. sinus   . HYDROcodone-acetaminophen (NORCO/VICODIN) 5-325 MG per tablet Take 1 tablet by mouth every 4 (four) hours as needed.  . nitrofurantoin (MACRODANTIN) 50 MG capsule Take 100 mg by mouth daily.   Marland Kitchen trimethoprim (TRIMPEX) 100 MG tablet Take 100 mg by mouth daily. Take one-half tablet daily  . Vitamin D, Cholecalciferol, 1000 UNITS CAPS Take 2 capsules by mouth daily. 2000 units daily     Allergies:   Eggs or egg-derived products; Lac bovis; Milk-related compounds; Sulfa antibiotics; and Sulfamethoxazole   Past Medical History:  Diagnosis Date  . Cataract of right eye   . Essential hypertension, benign   . History of cardiac catheterization    Reportedly no significant CAD 1998 - previously followed with Dr. Corinda Gubler  . Mixed hyperlipidemia   . Osteoporosis   . Recurrent UTI     Past Surgical History:  Procedure Laterality Date  . ABDOMINAL HYSTERECTOMY    . CATARACT EXTRACTION W/PHACO  09/17/2011   Procedure: CATARACT EXTRACTION PHACO AND INTRAOCULAR LENS PLACEMENT (IOC);  Surgeon: Gemma Payor;  Location: [facility];  Service: Ophthalmology;  Laterality: Right;  CDE:23.52  . CYSTOSCOPY    . FOOT SURGERY    . LIPOMA EXCISION     Back     Social History:  The patient  reports that she has quit smoking. Her smoking use included  Cigarettes. She has never used smokeless tobacco. She reports that she does not drink alcohol or use drugs.   Family History:  The patient's family history includes Cancer in her father and mother; Diabetes in her brother; Hypertension in her brother and mother; Stroke in her brother and mother.    ROS:  Please see the history of present illness. All other systems are reviewed and  Negative to the above problem except as noted.    PHYSICAL EXAM: VS:  BP (!) 148/86   Pulse 97   Ht 5\' 4"  (1.626 m)   Wt 65.8 kg (145 lb)   BMI 24.89 kg/m   GEN: Well nourished, well developed, in no acute distress  HEENT: normal  Neck: no JVD, carotid bruits, or masses Cardiac: RRR; no murmurs, rubs, or gallops,no edema  Respiratory:  clear to auscultation bilaterally, normal work of breathing GI: soft, nontender, nondistended, + BS  No hepatomegaly  MS: no deformity Moving all extremities   Skin: warm and dry, no rash Neuro:  Strength and sensation are intact Psych: euthymic mood, full affect   EKG:  EKG is ordered today.  SR 97 bpm  LAFB  Nonspecific ST changes     Lipid Panel No results found for: CHOL, TRIG, HDL, CHOLHDL, VLDL, LDLCALC, LDLDIRECT    Wt Readings  from Last 3 Encounters:  03/22/17 65.8 kg (145 lb)  09/02/15 62.6 kg (138 lb)  11/23/14 62.1 kg (137 lb)      ASSESSMENT AND PLAN:  1  Nonobstructive CAD  Remote cath  No symtpoms to sugg angina  2  HL  Pt did not tolerate statins in past   Will have labs soon by Dr Margo Aye Told her to have them faxed   3  CV dz  Pt with plaquing of carotids and subclavian  Continue ASA  Will look into lipid Rx    4  HTN  BP is up today  [facility] says higher than normal  She has cuff at home  Encouraged her to keep log  Take cuff and log to next appt with Dr Margo Aye  Plan for f/u in May  Will be in touch with her once I get lipids    Current medicines are reviewed at length with the patient today.  The patient does not have concerns regarding  medicines.  Signed, Dietrich Pates, MD  03/22/2017 4:46 PM    [facility] [address] [place], [place], [place]  [address] Phone: [phone]; Fax: [phone]

## 2017-03-22 ENCOUNTER — Encounter: Payer: Self-pay | Admitting: Internal Medicine

## 2017-03-22 ENCOUNTER — Ambulatory Visit (INDEPENDENT_AMBULATORY_CARE_PROVIDER_SITE_OTHER): Payer: Medicare Other | Admitting: Internal Medicine

## 2017-03-22 VITALS — BP 148/86 | HR 97 | Ht 64.0 in | Wt 145.0 lb

## 2017-03-22 DIAGNOSIS — I10 Essential (primary) hypertension: Secondary | ICD-10-CM

## 2017-03-22 DIAGNOSIS — E782 Mixed hyperlipidemia: Secondary | ICD-10-CM

## 2017-03-22 DIAGNOSIS — I779 Disorder of arteries and arterioles, unspecified: Secondary | ICD-10-CM | POA: Diagnosis not present

## 2017-03-22 DIAGNOSIS — I739 Peripheral vascular disease, unspecified: Secondary | ICD-10-CM

## 2017-03-22 NOTE — Patient Instructions (Signed)
Your physician recommends that you continue on your current medications as directed. Please refer to the Current Medication list given to you today.  Your physician wants you to follow-up in: May, 2019 with Dr. Ross.  You will receive a reminder letter in the mail two months in advance. If you don't receive a letter, please call our office to schedule the follow-up appointment.  

## 2017-06-11 DIAGNOSIS — I10 Essential (primary) hypertension: Secondary | ICD-10-CM | POA: Diagnosis not present

## 2017-06-11 DIAGNOSIS — E559 Vitamin D deficiency, unspecified: Secondary | ICD-10-CM | POA: Diagnosis not present

## 2017-06-11 DIAGNOSIS — E785 Hyperlipidemia, unspecified: Secondary | ICD-10-CM | POA: Diagnosis not present

## 2017-06-11 DIAGNOSIS — D509 Iron deficiency anemia, unspecified: Secondary | ICD-10-CM | POA: Diagnosis not present

## 2017-06-13 DIAGNOSIS — I10 Essential (primary) hypertension: Secondary | ICD-10-CM | POA: Diagnosis not present

## 2017-06-13 DIAGNOSIS — N184 Chronic kidney disease, stage 4 (severe): Secondary | ICD-10-CM | POA: Diagnosis not present

## 2017-06-13 DIAGNOSIS — D509 Iron deficiency anemia, unspecified: Secondary | ICD-10-CM | POA: Diagnosis not present

## 2017-06-13 DIAGNOSIS — Z23 Encounter for immunization: Secondary | ICD-10-CM | POA: Diagnosis not present

## 2017-06-13 DIAGNOSIS — J449 Chronic obstructive pulmonary disease, unspecified: Secondary | ICD-10-CM | POA: Diagnosis not present

## 2017-07-09 ENCOUNTER — Telehealth: Payer: Self-pay | Admitting: Internal Medicine

## 2017-07-09 NOTE — Telephone Encounter (Signed)
Please have pt scheduled in lipid clinic, thank you!

## 2017-07-09 NOTE — Telephone Encounter (Signed)
Notes recorded by Awilda Metro, RPH on 07/02/2017 at 11:36 AM EDT LDL far above goal < 70 and is intolerant to statins. I would be more than happy to see pt in lipid clinic to discuss initiation of PCSK9i therapy if pt is willing. ------  Notes recorded by Lendon Ka, RN on 07/02/2017 at 8:55 AM EDT Left message for patient to call back. Will review with pharmacy and schedule her with PharmD when patient calls back. ------  Notes recorded by Pricilla Riffle, MD on 06/20/2017 at 10:29 AM EDT LDL was 180 on recent check!1 Has vascular dz Did not tolerate statins Pt needs to lower Would recomm Repatha 2x per month F/U lipids in 2 to 3 months      Made patient an appointment to see pharmacy to discuss PCSK9i. Patient verbalized understanding.

## 2017-07-09 NOTE — Telephone Encounter (Signed)
Follow UP:     Pt says she is returning Dr Tenny Craw nurse call from last week.She said she would be available the next hour.

## 2017-07-27 NOTE — Progress Notes (Signed)
Patient ID: [name]                 DOB: [birth year]                    MRN: [id number]     HPI: [name] is a 71 y.o. female patient referred to lipid clinic by Dr. Tenny Craw. PMH is significant for HTN, osteoporosis, nonobstructive CAD, carotid artery disease (mild bilateral carotid plaquing on vascular US 08/2015). Pt noted to have recent lipid panel 8/21 demonstrating LDL 180 mg/dL. Pt has reported statin intolerance with leg weakness and currently takes no lipid lowering therapy.   Pt presents to clinic ambulating in good spirits. She states that she has not tried a statin in about 10 years. Pt reports that she has never tried Zetia but prefers not to try it as she has significant GI discomfort at baseline. Pt reports her primary doctor aquired insurance approval for Repatha but the cost will be ~$400/month which the pt cannot afford. Repatha's manufacturer ([employer]) sent the pt paperwork for copay assistance and she is currently working to get assistance for the 2019 year.  Current Medications: none  Intolerances: pitavastatin  QHS, Lipitor and Crestor at unknown doses - all caused muscle weakness of both legs which improved with cessation of statin  Risk Factors: HTN, carotid artery disease, nonobstructive CAD  LDL goal: < 70 mg/dL  Diet: no breakfast, lunch - sandwich, dinner - sandwich, meat and veggies, cheeseburger. Pt endorses eating out more than cooking, and eating fast food once or twice a weekly  Exercise: yard work 2-3 days a week  Family History: DM (brother), HTN (brother, mother), stroke (brother, mother)  Social History: Pt does not drink or use drugs. Pt has quit smoking.  Labs: Lipid Panel 8/21: TC 248, TG 93, HDL 49, LDL 180 (on no lipid lowering therapy)  Past Medical History:  Diagnosis Date  . Cataract of right eye   . Essential hypertension, benign   . History of cardiac catheterization    Reportedly no significant CAD 1998 - previously followed with  Dr. Corinda Gubler  . Mixed hyperlipidemia   . Osteoporosis   . Recurrent UTI     Current Outpatient Prescriptions on File Prior to Visit  Medication Sig Dispense Refill  . aspirin 81 MG tablet Take 81 mg by mouth daily.    . Chlorphen-PE-Acetaminophen (NOREL AD) 4-10-325 MG TABS Take 1 tablet by mouth daily as needed. sinus     . HYDROcodone-acetaminophen (NORCO/VICODIN) 5-325 MG per tablet Take 1 tablet by mouth every 4 (four) hours as needed. 90 tablet 0  . nitrofurantoin (MACRODANTIN) 50 MG capsule Take 100 mg by mouth daily.     Marland Kitchen trimethoprim (TRIMPEX) 100 MG tablet Take 100 mg by mouth daily. Take one-half tablet daily  10  . Vitamin D, Cholecalciferol, 1000 UNITS CAPS Take 2 capsules by mouth daily. 2000 units daily     No current facility-administered medications on file prior to visit.     Allergies  Allergen Reactions  . Eggs Or Egg-Derived Products Diarrhea  . Lac Bovis Nausea And Vomiting  . Milk-Related Compounds Diarrhea  . Sulfa Antibiotics Diarrhea  . Sulfamethoxazole Other (See Comments)    unknown    Assessment/Plan:  1. Hyperlipidemia - LDL remains uncontrolled above goal < /dL with history of statin intolerance to unknown doses of Crestor, Lipitor, and Livalo. Discussed options with pt including Repatha, Zetia, and retrial with statin therapy. Pt is  unwilling to try Zetia as she states she has IBS and is worried this will worsen symptoms. Pt is willing to retrial a low dose of a statin so will initiate pravastatin 20mg  QHS and uptitrate if pt does not redevelop myalgias. Counseled pt on dietary changes to reduce cholesterol. Pt's PCP submitted a prior authorization for Repatha which has been approved. Unfortunately, copay is cost prohibitive at $375 per month. Pt plans to submit paperwork for copay assistance for Repatha and will follow-up with her primary physician for this. Pt to follow-up with clinic if muscle symptoms recur, but if she tolerates pravastatin well  she will follow-up with lipid clinic to optimize the dosage.  Fredonia Highland, PharmD PGY-2 Cardiology Pharmacy Resident Pager: [phone] 07/29/2017

## 2017-07-29 ENCOUNTER — Encounter (INDEPENDENT_AMBULATORY_CARE_PROVIDER_SITE_OTHER): Payer: Self-pay

## 2017-07-29 ENCOUNTER — Ambulatory Visit (INDEPENDENT_AMBULATORY_CARE_PROVIDER_SITE_OTHER): Payer: Medicare Other | Admitting: Pharmacist

## 2017-07-29 DIAGNOSIS — E782 Mixed hyperlipidemia: Secondary | ICD-10-CM | POA: Diagnosis not present

## 2017-07-29 MED ORDER — PRAVASTATIN SODIUM 20 MG PO TABS: 20.0000 mg | ORAL_TABLET | Freq: Every evening | ORAL | 11 refills | Status: DC

## 2017-07-29 NOTE — Patient Instructions (Signed)
It was great to meet you today.  Please begin taking pravastatin 20mg  once daily at bedtime.   Continue to fill out the paperwork for Repatha copay assistance. Follow-up with your primary doctor with Repatha.  Please call us at [phone] with any problems with the pravastatin.

## 2017-08-06 DIAGNOSIS — Z87440 Personal history of urinary (tract) infections: Secondary | ICD-10-CM | POA: Diagnosis not present

## 2017-08-06 DIAGNOSIS — N390 Urinary tract infection, site not specified: Secondary | ICD-10-CM | POA: Diagnosis not present

## 2017-08-06 DIAGNOSIS — R319 Hematuria, unspecified: Secondary | ICD-10-CM | POA: Diagnosis not present

## 2017-08-06 DIAGNOSIS — N289 Disorder of kidney and ureter, unspecified: Secondary | ICD-10-CM | POA: Diagnosis not present

## 2017-08-06 DIAGNOSIS — Z87448 Personal history of other diseases of urinary system: Secondary | ICD-10-CM | POA: Diagnosis not present

## 2017-08-13 DIAGNOSIS — M79671 Pain in right foot: Secondary | ICD-10-CM | POA: Diagnosis not present

## 2017-08-13 DIAGNOSIS — M722 Plantar fascial fibromatosis: Secondary | ICD-10-CM | POA: Diagnosis not present

## 2017-08-13 DIAGNOSIS — M79672 Pain in left foot: Secondary | ICD-10-CM | POA: Diagnosis not present

## 2017-08-13 DIAGNOSIS — M25579 Pain in unspecified ankle and joints of unspecified foot: Secondary | ICD-10-CM | POA: Diagnosis not present

## 2017-09-02 DIAGNOSIS — M79671 Pain in right foot: Secondary | ICD-10-CM | POA: Diagnosis not present

## 2017-09-02 DIAGNOSIS — M79672 Pain in left foot: Secondary | ICD-10-CM | POA: Diagnosis not present

## 2017-09-02 DIAGNOSIS — M25579 Pain in unspecified ankle and joints of unspecified foot: Secondary | ICD-10-CM | POA: Diagnosis not present

## 2017-09-02 DIAGNOSIS — M722 Plantar fascial fibromatosis: Secondary | ICD-10-CM | POA: Diagnosis not present

## 2018-01-03 ENCOUNTER — Telehealth: Payer: Self-pay | Admitting: Internal Medicine

## 2018-01-03 NOTE — Telephone Encounter (Signed)
Patient calling states that she qualifies for the Repatha medication and would like to know where to get the shot?

## 2018-01-03 NOTE — Telephone Encounter (Signed)
She reports that she will get first dose next week sometime. She asked where to give injection. Advised belly or thigh and offered for her to come to office to give first injection. She states she would like to do that. She will call back next week once she has medication to schedule time for her to come give first shot.

## 2018-03-03 ENCOUNTER — Encounter: Payer: Self-pay | Admitting: Internal Medicine

## 2018-03-18 DIAGNOSIS — H35341 Macular cyst, hole, or pseudohole, right eye: Secondary | ICD-10-CM | POA: Diagnosis not present

## 2018-03-18 DIAGNOSIS — H35372 Puckering of macula, left eye: Secondary | ICD-10-CM | POA: Diagnosis not present

## 2018-03-24 ENCOUNTER — Ambulatory Visit: Payer: Medicare Other | Admitting: Internal Medicine

## 2018-03-24 ENCOUNTER — Encounter: Payer: Self-pay | Admitting: Internal Medicine

## 2018-03-24 ENCOUNTER — Encounter (INDEPENDENT_AMBULATORY_CARE_PROVIDER_SITE_OTHER): Payer: Self-pay

## 2018-03-24 VITALS — BP 144/100 | HR 90 | Ht 64.0 in | Wt 142.8 lb

## 2018-03-24 DIAGNOSIS — I25810 Atherosclerosis of coronary artery bypass graft(s) without angina pectoris: Secondary | ICD-10-CM | POA: Diagnosis not present

## 2018-03-24 DIAGNOSIS — I779 Disorder of arteries and arterioles, unspecified: Secondary | ICD-10-CM

## 2018-03-24 DIAGNOSIS — I10 Essential (primary) hypertension: Secondary | ICD-10-CM

## 2018-03-24 DIAGNOSIS — E782 Mixed hyperlipidemia: Secondary | ICD-10-CM | POA: Diagnosis not present

## 2018-03-24 DIAGNOSIS — I739 Peripheral vascular disease, unspecified: Secondary | ICD-10-CM

## 2018-03-24 NOTE — Progress Notes (Signed)
Cardiology Office Note   Date:  03/24/2018   ID:  Lareen, Mullings 16-Feb-1946, MRN [id number]  PCP:  Benita Stabile, MD  Cardiologist:   Dietrich Pates, MD   F/U of CAD  History of Present Illness: [name] is a 72 y.o. female with a history of nonobstructive CAD, HTN and HL  I saw her in October 2018    [facility] denies CP   Breathing is OK  Se is off meds   Does not want to take statin   Says she has problems with kidneys  Meds can effect  Current Meds  Medication Sig  . trimethoprim (TRIMPEX) 100 MG tablet Take 100 mg by mouth daily. Take one-half tablet daily     Allergies:   Eggs or egg-derived products; Lac bovis; Milk-related compounds; Sulfa antibiotics; and Sulfamethoxazole   Past Medical History:  Diagnosis Date  . Cataract of right eye   . Essential hypertension, benign   . History of cardiac catheterization    Reportedly no significant CAD 1998 - previously followed with Dr. Corinda Gubler  . Mixed hyperlipidemia   . Osteoporosis   . Recurrent UTI     Past Surgical History:  Procedure Laterality Date  . ABDOMINAL HYSTERECTOMY    . CATARACT EXTRACTION W/PHACO  09/17/2011   Procedure: CATARACT EXTRACTION PHACO AND INTRAOCULAR LENS PLACEMENT (IOC);  Surgeon: Gemma Payor;  Location: [facility];  Service: Ophthalmology;  Laterality: Right;  CDE:23.52  . CYSTOSCOPY    . FOOT SURGERY    . LIPOMA EXCISION     Back     Social History:  The patient  reports that she has quit smoking. Her smoking use included cigarettes. She has never used smokeless tobacco. She reports that she does not drink alcohol or use drugs.   Family History:  The patient's family history includes Cancer in her father and mother; Diabetes in her brother; Hypertension in her brother and mother; Stroke in her brother and mother.    ROS:  Please see the history of present illness. All other systems are reviewed and  Negative to the above problem except as noted.    PHYSICAL EXAM: VS:  BP (!) 144/100    Pulse 90   Ht 5\' 4"  (1.626 m)   Wt 64.8 kg (142 lb 12.8 oz)   BMI 24.51 kg/m   BP on my check 146/94  \ GEN: Well nourished, well developed, in no acute distress  HEENT: normal  Neck: no JVD, carotid bruits, or masses Cardiac: RRR; no murmurs, rubs, or gallops,no edema  Respiratory:  clear to auscultation bilaterally, normal work of breathing GI: soft, nontender, nondistended, + BS  No hepatomegaly  MS: no deformity Moving all extremities   Skin: warm and dry, no rash Neuro:  Strength and sensation are intact Psych: euthymic mood, full affect   EKG:  EKG is ordered today.  SR 90 bpm    LAFB     Lipid Panel No results found for: CHOL, TRIG, HDL, CHOLHDL, VLDL, LDLCALC, LDLDIRECT    Wt Readings from Last 3 Encounters:  03/24/18 64.8 kg (142 lb 12.8 oz)  03/22/17 65.8 kg (145 lb)  09/02/15 62.6 kg (138 lb)      ASSESSMENT AND PLAN:  1  Nonobstructive CAD  Remote cath  No CP   2  HL  Pt did not tolerate statins in past  Does not want to try other meds    3  CV dz  Pt with  plaquing of carotids and subclavian  Off of ASA    4  HTN  BP is up today again.   She does not want to  Try meds     WIll f/uwith Dr Margo Aye   I told her it is important to control to protect vessels, preserve kidney function   Plan for f/u in 1 6ear    Current medicines are reviewed at length with the patient today.  The patient does not have concerns regarding medicines.  Signed, Dietrich Pates, MD  03/24/2018 3:56 PM    [facility] [address] [place], [place], [place]  [address] Phone: [phone]; Fax: [phone]

## 2018-03-24 NOTE — Patient Instructions (Signed)
Your physician recommends that you continue on your current medications as directed. Please refer to the Current Medication list given to you today. Your physician wants you to follow-up in: 1 year with Dr. Ross.  You will receive a reminder letter in the mail two months in advance. If you don't receive a letter, please call our office to schedule the follow-up appointment.  

## 2018-06-10 DIAGNOSIS — I10 Essential (primary) hypertension: Secondary | ICD-10-CM | POA: Diagnosis not present

## 2018-06-10 DIAGNOSIS — E559 Vitamin D deficiency, unspecified: Secondary | ICD-10-CM | POA: Diagnosis not present

## 2018-06-10 DIAGNOSIS — E785 Hyperlipidemia, unspecified: Secondary | ICD-10-CM | POA: Diagnosis not present

## 2018-06-10 DIAGNOSIS — N184 Chronic kidney disease, stage 4 (severe): Secondary | ICD-10-CM | POA: Diagnosis not present

## 2018-06-13 DIAGNOSIS — D631 Anemia in chronic kidney disease: Secondary | ICD-10-CM | POA: Diagnosis not present

## 2018-06-13 DIAGNOSIS — Z0001 Encounter for general adult medical examination with abnormal findings: Secondary | ICD-10-CM | POA: Diagnosis not present

## 2018-06-13 DIAGNOSIS — I10 Essential (primary) hypertension: Secondary | ICD-10-CM | POA: Diagnosis not present

## 2018-06-13 DIAGNOSIS — N184 Chronic kidney disease, stage 4 (severe): Secondary | ICD-10-CM | POA: Diagnosis not present

## 2018-06-17 ENCOUNTER — Other Ambulatory Visit: Payer: Self-pay | Admitting: Internal Medicine

## 2018-06-17 DIAGNOSIS — Z780 Asymptomatic menopausal state: Secondary | ICD-10-CM

## 2018-06-17 DIAGNOSIS — E2839 Other primary ovarian failure: Secondary | ICD-10-CM

## 2018-07-10 DIAGNOSIS — N189 Chronic kidney disease, unspecified: Secondary | ICD-10-CM | POA: Diagnosis not present

## 2018-07-10 DIAGNOSIS — N289 Disorder of kidney and ureter, unspecified: Secondary | ICD-10-CM | POA: Diagnosis not present

## 2018-07-10 DIAGNOSIS — N281 Cyst of kidney, acquired: Secondary | ICD-10-CM | POA: Diagnosis not present

## 2018-07-10 DIAGNOSIS — N2889 Other specified disorders of kidney and ureter: Secondary | ICD-10-CM | POA: Diagnosis not present

## 2018-07-10 DIAGNOSIS — R82998 Other abnormal findings in urine: Secondary | ICD-10-CM | POA: Diagnosis not present

## 2018-07-10 DIAGNOSIS — Z09 Encounter for follow-up examination after completed treatment for conditions other than malignant neoplasm: Secondary | ICD-10-CM | POA: Diagnosis not present

## 2018-07-10 DIAGNOSIS — N390 Urinary tract infection, site not specified: Secondary | ICD-10-CM | POA: Diagnosis not present

## 2018-07-14 ENCOUNTER — Ambulatory Visit (HOSPITAL_COMMUNITY): Payer: Medicare Other

## 2018-07-14 ENCOUNTER — Ambulatory Visit (HOSPITAL_COMMUNITY): Admission: RE | Admit: 2018-07-14 | Payer: Medicare Other | Source: Ambulatory Visit

## 2018-07-14 ENCOUNTER — Other Ambulatory Visit (HOSPITAL_COMMUNITY): Payer: Medicare Other

## 2018-07-14 ENCOUNTER — Ambulatory Visit (HOSPITAL_COMMUNITY)
Admission: RE | Admit: 2018-07-14 | Discharge: 2018-07-14 | Disposition: A | Discharge status: Home / Self Care | Payer: Medicare Other | Source: Ambulatory Visit | Attending: Internal Medicine | Admitting: Internal Medicine

## 2018-07-14 DIAGNOSIS — M810 Age-related osteoporosis without current pathological fracture: Secondary | ICD-10-CM | POA: Diagnosis not present

## 2018-07-14 DIAGNOSIS — Z780 Asymptomatic menopausal state: Secondary | ICD-10-CM | POA: Diagnosis not present

## 2018-07-14 DIAGNOSIS — M8588 Other specified disorders of bone density and structure, other site: Secondary | ICD-10-CM | POA: Diagnosis not present

## 2018-07-24 ENCOUNTER — Ambulatory Visit (HOSPITAL_COMMUNITY)
Admission: RE | Admit: 2018-07-24 | Discharge: 2018-07-24 | Disposition: A | Discharge status: Home / Self Care | Payer: Medicare Other | Source: Ambulatory Visit | Attending: Internal Medicine | Admitting: Internal Medicine

## 2018-07-24 DIAGNOSIS — Z1231 Encounter for screening mammogram for malignant neoplasm of breast: Secondary | ICD-10-CM | POA: Diagnosis not present

## 2018-07-24 DIAGNOSIS — E2839 Other primary ovarian failure: Secondary | ICD-10-CM | POA: Insufficient documentation

## 2018-07-25 ENCOUNTER — Other Ambulatory Visit (HOSPITAL_COMMUNITY): Payer: Self-pay | Admitting: Internal Medicine

## 2018-07-25 DIAGNOSIS — R928 Other abnormal and inconclusive findings on diagnostic imaging of breast: Secondary | ICD-10-CM

## 2018-08-12 ENCOUNTER — Ambulatory Visit (HOSPITAL_COMMUNITY)
Admission: RE | Admit: 2018-08-12 | Discharge: 2018-08-12 | Disposition: A | Discharge status: Home / Self Care | Payer: Medicare Other | Source: Ambulatory Visit | Attending: Internal Medicine | Admitting: Internal Medicine

## 2018-08-12 DIAGNOSIS — R928 Other abnormal and inconclusive findings on diagnostic imaging of breast: Secondary | ICD-10-CM | POA: Diagnosis not present

## 2018-08-12 DIAGNOSIS — R922 Inconclusive mammogram: Secondary | ICD-10-CM | POA: Diagnosis not present

## 2018-08-12 DIAGNOSIS — N6321 Unspecified lump in the left breast, upper outer quadrant: Secondary | ICD-10-CM | POA: Diagnosis not present

## 2018-10-20 DIAGNOSIS — R399 Unspecified symptoms and signs involving the genitourinary system: Secondary | ICD-10-CM | POA: Diagnosis not present

## 2019-06-18 DIAGNOSIS — N390 Urinary tract infection, site not specified: Secondary | ICD-10-CM | POA: Diagnosis not present

## 2019-06-18 DIAGNOSIS — N289 Disorder of kidney and ureter, unspecified: Secondary | ICD-10-CM | POA: Diagnosis not present

## 2019-09-19 DIAGNOSIS — I10 Essential (primary) hypertension: Secondary | ICD-10-CM | POA: Diagnosis not present

## 2019-09-19 DIAGNOSIS — R319 Hematuria, unspecified: Secondary | ICD-10-CM | POA: Diagnosis not present

## 2019-09-19 DIAGNOSIS — R1084 Generalized abdominal pain: Secondary | ICD-10-CM | POA: Diagnosis not present

## 2019-09-21 ENCOUNTER — Other Ambulatory Visit (HOSPITAL_COMMUNITY): Payer: Self-pay | Admitting: Internal Medicine

## 2019-09-21 ENCOUNTER — Other Ambulatory Visit: Payer: Self-pay | Admitting: Internal Medicine

## 2019-09-21 DIAGNOSIS — D631 Anemia in chronic kidney disease: Secondary | ICD-10-CM | POA: Diagnosis not present

## 2019-09-21 DIAGNOSIS — Z0001 Encounter for general adult medical examination with abnormal findings: Secondary | ICD-10-CM | POA: Diagnosis not present

## 2019-09-21 DIAGNOSIS — N184 Chronic kidney disease, stage 4 (severe): Secondary | ICD-10-CM | POA: Diagnosis not present

## 2019-09-21 DIAGNOSIS — I10 Essential (primary) hypertension: Secondary | ICD-10-CM | POA: Diagnosis not present

## 2019-09-21 DIAGNOSIS — R1084 Generalized abdominal pain: Secondary | ICD-10-CM

## 2019-09-21 DIAGNOSIS — Z1231 Encounter for screening mammogram for malignant neoplasm of breast: Secondary | ICD-10-CM

## 2019-09-28 ENCOUNTER — Other Ambulatory Visit: Payer: Self-pay

## 2019-09-28 ENCOUNTER — Ambulatory Visit (HOSPITAL_COMMUNITY): Admission: RE | Admit: 2019-09-28 | Payer: Medicare Other | Source: Ambulatory Visit

## 2019-09-28 ENCOUNTER — Ambulatory Visit (HOSPITAL_COMMUNITY)
Admission: RE | Admit: 2019-09-28 | Discharge: 2019-09-28 | Disposition: A | Discharge status: Home / Self Care | Payer: Medicare Other | Source: Ambulatory Visit | Attending: Internal Medicine | Admitting: Internal Medicine

## 2019-09-28 DIAGNOSIS — R1084 Generalized abdominal pain: Secondary | ICD-10-CM

## 2019-09-28 DIAGNOSIS — R109 Unspecified abdominal pain: Secondary | ICD-10-CM | POA: Diagnosis not present

## 2019-10-08 ENCOUNTER — Ambulatory Visit (HOSPITAL_COMMUNITY): Payer: Medicare Other

## 2019-10-19 ENCOUNTER — Other Ambulatory Visit: Payer: Self-pay

## 2019-10-19 ENCOUNTER — Ambulatory Visit (HOSPITAL_COMMUNITY)
Admission: RE | Admit: 2019-10-19 | Discharge: 2019-10-19 | Disposition: A | Discharge status: Home / Self Care | Payer: Medicare Other | Source: Ambulatory Visit | Attending: Internal Medicine | Admitting: Internal Medicine

## 2019-10-19 DIAGNOSIS — Z1231 Encounter for screening mammogram for malignant neoplasm of breast: Secondary | ICD-10-CM | POA: Diagnosis not present

## 2019-12-03 ENCOUNTER — Telehealth: Payer: Self-pay

## 2019-12-03 NOTE — Telephone Encounter (Signed)
Virtual Visit Pre-Appointment Phone Call  "(Name), I am calling you today to discuss your upcoming appointment. We are currently trying to limit exposure to the virus that causes COVID-19 by seeing patients at home rather than in the office."  1. "What is the BEST phone number to call the day of the visit?" - include this in appointment notes  2. "Do you have or have access to (through a family member/friend) a smartphone with video capability that we can use for your visit?" a. If yes - list this number in appt notes as "cell" (if different from BEST phone #) and list the appointment type as a VIDEO visit in appointment notes b. If no - list the appointment type as a PHONE visit in appointment notes  Confirm consent - "In the setting of the current Covid19 crisis, you are scheduled for a (phone or video) visit with your provider on (date) at (time).  Just as we do with many in-office visits, in order for you to participate in this visit, we must obtain consent.  If you'd like, I can send this to your mychart (if signed up) or email for you to review.  Otherwise, I can obtain your verbal consent now.  All virtual visits are billed to your insurance company just like a normal visit would be.  By agreeing to a virtual visit, we'd like you to understand that the technology does not allow for your provider to perform an examination, and thus may limit your provider's ability to fully assess your condition. If your provider identifies any concerns that need to be evaluated in person, we will make arrangements to do so.  Finally, though the technology is pretty good, we cannot assure that it will always work on either your or our end, and in the setting of a video visit, we may have to convert it to a phone-only visit.  In either situation, we cannot ensure that we have a secure connection.  Are you willing to proceed?" STAFF: Did the patient verbally acknowledge consent to telehealth visit? Document  YES/NO here:  3. Advise patient to be prepared - "Two hours prior to your appointment, go ahead and check your blood pressure, pulse, oxygen saturation, and your weight (if you have the equipment to check those) and write them all down. When your visit starts, your provider will ask you for this information. If you have an Apple Watch or Kardia device, please plan to have heart rate information ready on the day of your appointment. Please have a pen and paper handy nearby the day of the visit as well."  4. Give patient instructions for MyChart download to smartphone OR Doximity/Doxy.me as below if video visit (depending on what platform provider is using)  5. Inform patient they will receive a phone call 15 minutes prior to their appointment time (may be from unknown caller ID) so they should be prepared to answer    TELEPHONE CALL NOTE  [name] has been deemed a candidate for a follow-up tele-health visit to limit community exposure during the Covid-19 pandemic. I spoke with the patient via phone to ensure availability of phone/video source, confirm preferred email & phone number, and discuss instructions and expectations.  I reminded [name] to be prepared with any vital sign and/or heart rhythm information that could potentially be obtained via home monitoring, at the time of her visit. I reminded [name] to expect a phone call prior to her visit.  Gracy Bruins 12/03/2019 1:44 PM   INSTRUCTIONS FOR DOWNLOADING THE MYCHART APP TO SMARTPHONE  - The patient must first make sure to have activated MyChart and know their login information - If Apple, go to [employer] and type in MyChart in the search bar and download the app. If Android, ask patient to go to [employer] and type in [place] in the search bar and download the app. The app is free but as with any other app downloads, their phone may require them to verify saved payment information or Apple/Android  password.  - The patient will need to then log into the app with their MyChart username and password, and select [facility] as their healthcare provider to link the account. When it is time for your visit, go to the MyChart app, find appointments, and click Begin Video Visit. Be sure to Select Allow for your device to access the Microphone and Camera for your visit. You will then be connected, and your provider will be with you shortly.  **If they have any issues connecting, or need assistance please contact MyChart service desk [phone])**  **If using a computer, in order to ensure the best quality for their visit they will need to use either of the following Internet Browsers: [employer] or Google Chrome**  IF USING DOXIMITY or DOXY.ME - The patient will receive a link just prior to their visit by text.     FULL LENGTH CONSENT FOR TELE-HEALTH VISIT   I hereby voluntarily request, consent and authorize [facility] and its employed or contracted physicians, physician assistants, nurse practitioners or other licensed health care professionals (the Practitioner), to provide me with telemedicine health care services (the "Services") as deemed necessary by the treating Practitioner. I acknowledge and consent to receive the Services by the Practitioner via telemedicine. I understand that the telemedicine visit will involve communicating with the Practitioner through live audiovisual communication technology and the disclosure of certain medical information by electronic transmission. I acknowledge that I have been given the opportunity to request an in-person assessment or other available alternative prior to the telemedicine visit and am voluntarily participating in the telemedicine visit.  I understand that I have the right to withhold or withdraw my consent to the use of telemedicine in the course of my care at any time, without affecting my right to future care or treatment,  and that the Practitioner or I may terminate the telemedicine visit at any time. I understand that I have the right to inspect all information obtained and/or recorded in the course of the telemedicine visit and may receive copies of available information for a reasonable fee.  I understand that some of the potential risks of receiving the Services via telemedicine include:  Marland Kitchen Delay or interruption in medical evaluation due to technological equipment failure or disruption; . Information transmitted may not be sufficient (e.g. poor resolution of images) to allow for appropriate medical decision making by the Practitioner; and/or  . In rare instances, security protocols could fail, causing a breach of personal health information.  Furthermore, I acknowledge that it is my responsibility to provide information about my medical history, conditions and care that is complete and accurate to the best of my ability. I acknowledge that Practitioner's advice, recommendations, and/or decision may be based on factors not within their control, such as incomplete or inaccurate data provided by me or distortions of diagnostic images or specimens that may result from electronic transmissions. I understand that the practice  of medicine is not an Chief Strategy Officer and that Practitioner makes no warranties or guarantees regarding treatment outcomes. I acknowledge that I will receive a copy of this consent concurrently upon execution via email to the email address I last provided but may also request a printed copy by calling the office of [facility].    I understand that my insurance will be billed for this visit.   I have read or had this consent read to me. . I understand the contents of this consent, which adequately explains the benefits and risks of the Services being provided via telemedicine.  . I have been provided ample opportunity to ask questions regarding this consent and the Services and have had my questions  answered to my satisfaction. . I give my informed consent for the services to be provided through the use of telemedicine in my medical care  By participating in this telemedicine visit I agree to the above.

## 2019-12-04 ENCOUNTER — Telehealth: Payer: Medicare Other | Admitting: Internal Medicine

## 2019-12-04 NOTE — Progress Notes (Unsigned)
Virtual Visit via Telephone Note   This visit type was conducted due to national recommendations for restrictions regarding the COVID-19 Pandemic (e.g. social distancing) in an effort to limit this patient's exposure and mitigate transmission in our community.  Due to her co-morbid illnesses, this patient is at least at moderate risk for complications without adequate follow up.  This format is felt to be most appropriate for this patient at this time.  The patient did not have access to video technology/had technical difficulties with video requiring transitioning to audio format only (telephone).  All issues noted in this document were discussed and addressed.  No physical exam could be performed with this format.  Please refer to the patient's chart for her  consent to telehealth for [facility].   Date:  12/04/2019   ID:  Powell, Caitlin 1946-08-19, MRN [id number]  Patient Location: Home Provider:  Office  PCP:  Benita Stabile, MD  Cardiologist:  Tenny Craw  Evaluation Performed:  Follow-Up Visit  Chief Complaint:    History of Present Illness:    [name] is a 74 y.o. female with a history of HTN, nonobstructive CAD (remote cath), CV dz HL.  I saw her in 2019  Pt hs been taking BP at home   Today  157/84    P 75 Day before:  158/89    Day prior 155/82  P 76    Pt has been laying in bed for 3 days   L kidney has been hurting her  She takes trimethoprim  Pt denies CP   Breathing is OK     Pt seen by Dr. Margo Aye assistant   In November Also sees Dr Logan [facility]  The patient does nothave symptoms concerning for COVID-19 infection (fever, chills, cough, or new shortness of breath).    Past Medical History:  Diagnosis Date  . Cataract of right eye   . Essential hypertension, benign   . History of cardiac catheterization    Reportedly no significant CAD 1998 - previously followed with Dr. Corinda Gubler  . Mixed hyperlipidemia   . Osteoporosis   . Recurrent UTI    Past  Surgical History:  Procedure Laterality Date  . ABDOMINAL HYSTERECTOMY    . CATARACT EXTRACTION W/PHACO  09/17/2011   Procedure: CATARACT EXTRACTION PHACO AND INTRAOCULAR LENS PLACEMENT (IOC);  Surgeon: Gemma Payor;  Location: [facility];  Service: Ophthalmology;  Laterality: Right;  CDE:23.52  . CYSTOSCOPY    . FOOT SURGERY    . LIPOMA EXCISION     Back     No outpatient medications have been marked as taking for the 12/04/19 encounter (Appointment) with Pricilla Riffle, MD.     Allergies:   Eggs or egg-derived products, Lac bovis, Milk-related compounds, Sulfa antibiotics, and Sulfamethoxazole   Social History   Tobacco Use  . Smoking status: Former Smoker    Types: Cigarettes  . Smokeless tobacco: Never Used  Substance Use Topics  . Alcohol use: No  . Drug use: No     Family Hx: The patient's family history includes Cancer in her father and mother; Diabetes in her brother; Hypertension in her brother and mother; Stroke in her brother and mother.  ROS:   Please see the history of present illness.     All other systems reviewed and are negative.      Labs/Other Tests and Data Reviewed:    EKG:  No ECG reviewed.  Recent Labs: No results found for  requested labs within last 8760 hours.   Recent Lipid Panel No results found for: CHOL, TRIG, HDL, CHOLHDL, LDLCALC, LDLDIRECT  Wt Readings from Last 3 Encounters:  03/24/18 142 lb 12.8 oz (64.8 kg)  03/22/17 145 lb (65.8 kg)  09/02/15 138 lb (62.6 kg)     Objective:    Vital Signs:  There were no vitals taken for this visit.     ASSESSMENT & PLAN:    1. CAD  Denies CP    2  HTN  Pt is very reluctant to start BP med  Worried about kidneys      3  HL Has labs at Dr Durene Cal    4  CV dz    Mild plaquing of carotid arteries and mod subclavian stenosis  (2016)  Pt was very irrate  Did not want appt  Does not want to be seen  Does not want to change meds      Will notify Dr Nevada Crane   Will not plan further f/u in  cardiology   Pt thanked me at end to dismiss from f/u.    COVID-19 Education: The signs and symptoms of COVID-19 were discussed with the patient and how to seek care for testing (follow up with PCP or arrange E-visit).  ***The importance of social distancing was discussed today.  Time:   Today, I have spent *** minutes with the patient with telehealth technology discussing the above problems.     Medication Adjustments/Labs and Tests Ordered: Current medicines are reviewed at length with the patient today.  Concerns regarding medicines are outlined above.   Tests Ordered: No orders of the defined types were placed in this encounter.   Medication Changes: No orders of the defined types were placed in this encounter.   Follow Up:  {F/U [phone]} {follow up:15908}  Signed, Dorris Carnes, MD  12/04/2019 4:36 PM    [facility]

## 2020-01-25 DIAGNOSIS — E782 Mixed hyperlipidemia: Secondary | ICD-10-CM | POA: Diagnosis not present

## 2020-01-25 DIAGNOSIS — E739 Lactose intolerance, unspecified: Secondary | ICD-10-CM | POA: Diagnosis not present

## 2020-01-25 DIAGNOSIS — D631 Anemia in chronic kidney disease: Secondary | ICD-10-CM | POA: Diagnosis not present

## 2020-01-25 DIAGNOSIS — E559 Vitamin D deficiency, unspecified: Secondary | ICD-10-CM | POA: Diagnosis not present

## 2020-01-28 DIAGNOSIS — N184 Chronic kidney disease, stage 4 (severe): Secondary | ICD-10-CM | POA: Diagnosis not present

## 2020-01-28 DIAGNOSIS — Z0001 Encounter for general adult medical examination with abnormal findings: Secondary | ICD-10-CM | POA: Diagnosis not present

## 2020-01-28 DIAGNOSIS — I10 Essential (primary) hypertension: Secondary | ICD-10-CM | POA: Diagnosis not present

## 2020-01-28 DIAGNOSIS — D631 Anemia in chronic kidney disease: Secondary | ICD-10-CM | POA: Diagnosis not present

## 2020-01-28 DIAGNOSIS — J449 Chronic obstructive pulmonary disease, unspecified: Secondary | ICD-10-CM | POA: Diagnosis not present

## 2020-01-29 ENCOUNTER — Telehealth: Payer: Self-pay

## 2020-01-29 NOTE — Telephone Encounter (Signed)
Notes on file.  

## 2020-06-21 DIAGNOSIS — R319 Hematuria, unspecified: Secondary | ICD-10-CM | POA: Diagnosis not present

## 2020-06-21 DIAGNOSIS — Z09 Encounter for follow-up examination after completed treatment for conditions other than malignant neoplasm: Secondary | ICD-10-CM | POA: Diagnosis not present

## 2020-06-21 DIAGNOSIS — N390 Urinary tract infection, site not specified: Secondary | ICD-10-CM | POA: Diagnosis not present

## 2020-06-21 DIAGNOSIS — N281 Cyst of kidney, acquired: Secondary | ICD-10-CM | POA: Diagnosis not present

## 2020-06-21 DIAGNOSIS — N289 Disorder of kidney and ureter, unspecified: Secondary | ICD-10-CM | POA: Diagnosis not present

## 2020-08-09 ENCOUNTER — Other Ambulatory Visit: Payer: Medicare Other

## 2020-08-09 ENCOUNTER — Other Ambulatory Visit: Payer: Self-pay

## 2020-08-09 DIAGNOSIS — Z20822 Contact with and (suspected) exposure to covid-19: Secondary | ICD-10-CM | POA: Diagnosis not present

## 2020-08-10 LAB — SARS-COV-2, NAA 2 DAY TAT

## 2020-08-10 LAB — NOVEL CORONAVIRUS, NAA: SARS-CoV-2, NAA: NOT DETECTED

## 2020-12-20 DIAGNOSIS — Z0001 Encounter for general adult medical examination with abnormal findings: Secondary | ICD-10-CM | POA: Diagnosis not present

## 2020-12-20 DIAGNOSIS — D631 Anemia in chronic kidney disease: Secondary | ICD-10-CM | POA: Diagnosis not present

## 2020-12-20 DIAGNOSIS — I10 Essential (primary) hypertension: Secondary | ICD-10-CM | POA: Diagnosis not present

## 2020-12-20 DIAGNOSIS — N184 Chronic kidney disease, stage 4 (severe): Secondary | ICD-10-CM | POA: Diagnosis not present

## 2020-12-22 DIAGNOSIS — I10 Essential (primary) hypertension: Secondary | ICD-10-CM | POA: Diagnosis not present

## 2020-12-22 DIAGNOSIS — J449 Chronic obstructive pulmonary disease, unspecified: Secondary | ICD-10-CM | POA: Diagnosis not present

## 2020-12-22 DIAGNOSIS — D631 Anemia in chronic kidney disease: Secondary | ICD-10-CM | POA: Diagnosis not present

## 2020-12-22 DIAGNOSIS — Z0001 Encounter for general adult medical examination with abnormal findings: Secondary | ICD-10-CM | POA: Diagnosis not present

## 2021-05-03 IMAGING — US US ABDOMEN COMPLETE
1 series · 14 of 25 positions shown · non-contrast
Comparison: None.

CLINICAL DATA: LEFT-sided flank pain

EXAM:
ABDOMEN ULTRASOUND COMPLETE

[Series 1: us abdomen complete · 0.16mm/px · 14 of 226 slices shown]
[im 1/226]
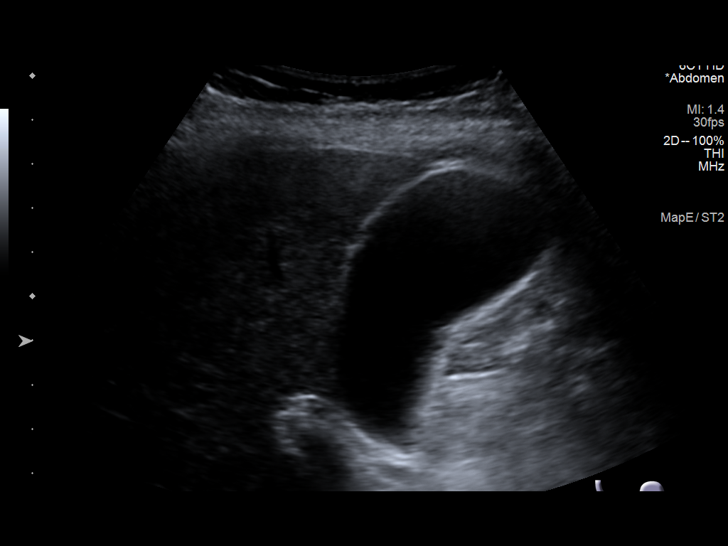
[im 19/226]
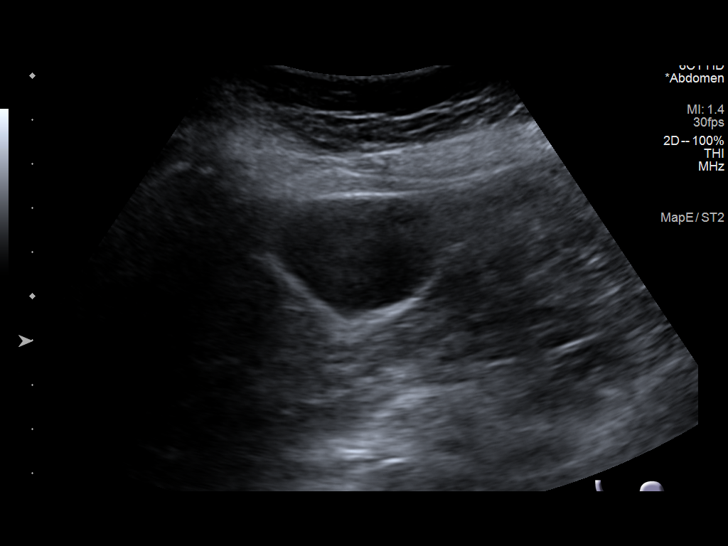
[im 38/226]
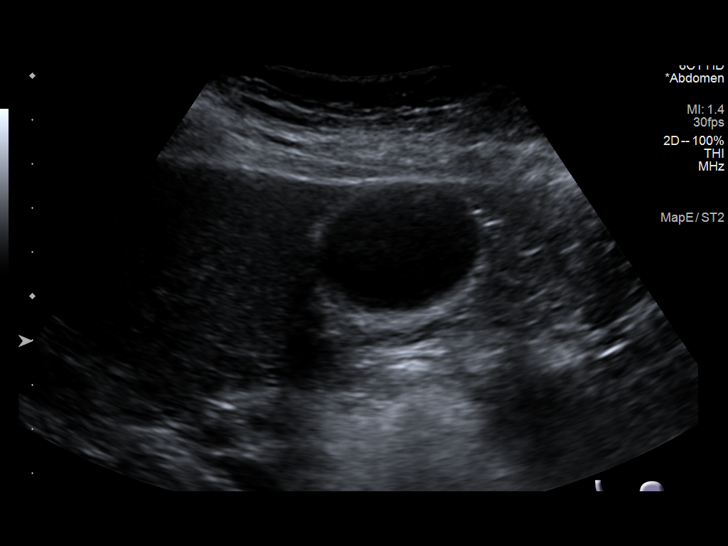
[im 57/226]
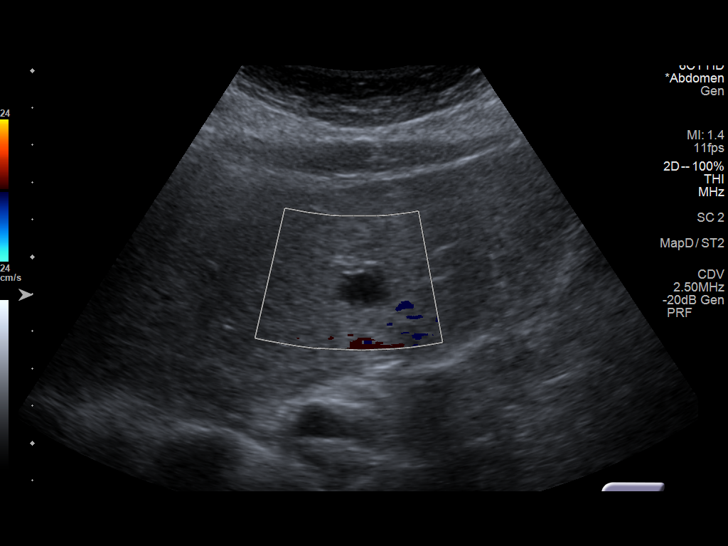
[im 76/226]
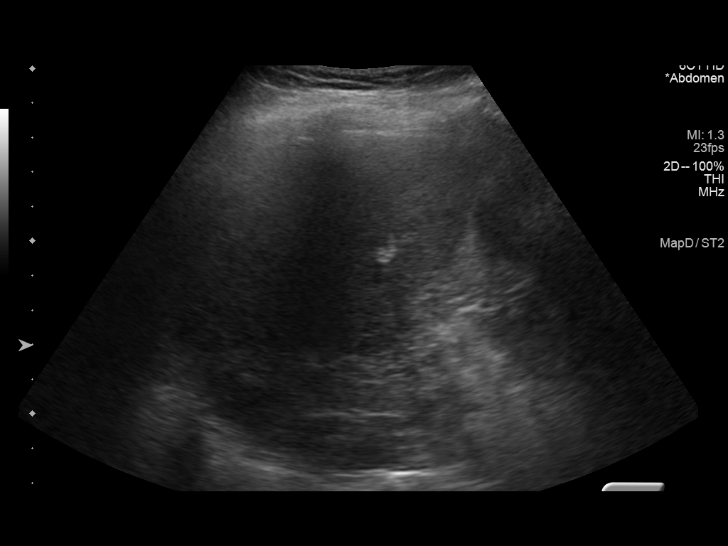
[im 85/226]
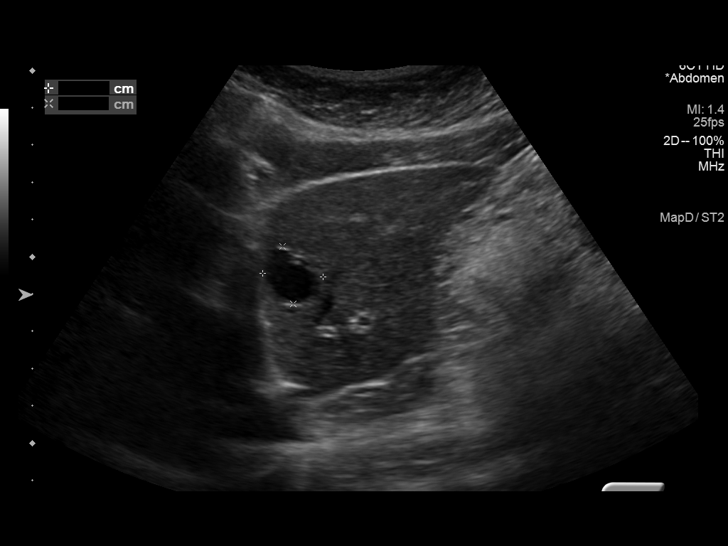
[im 104/226]
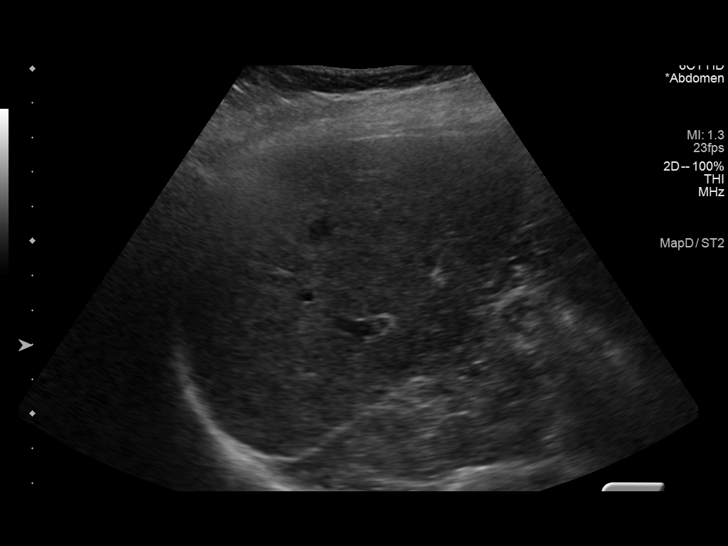
[im 122/226]
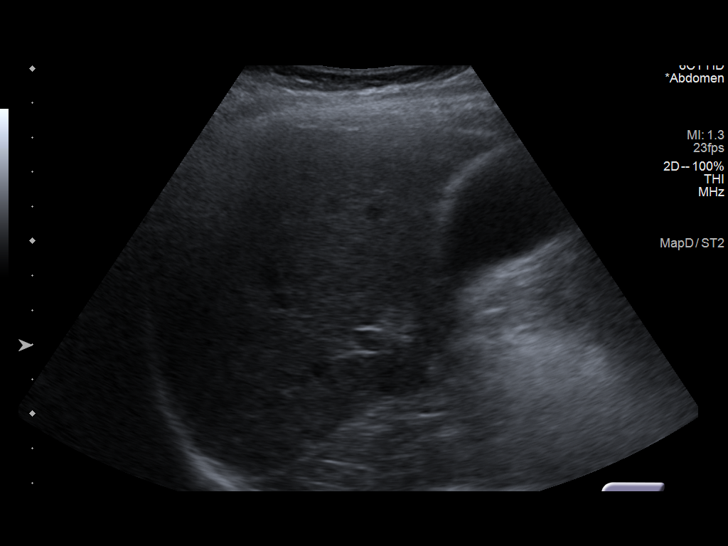
[im 141/226]
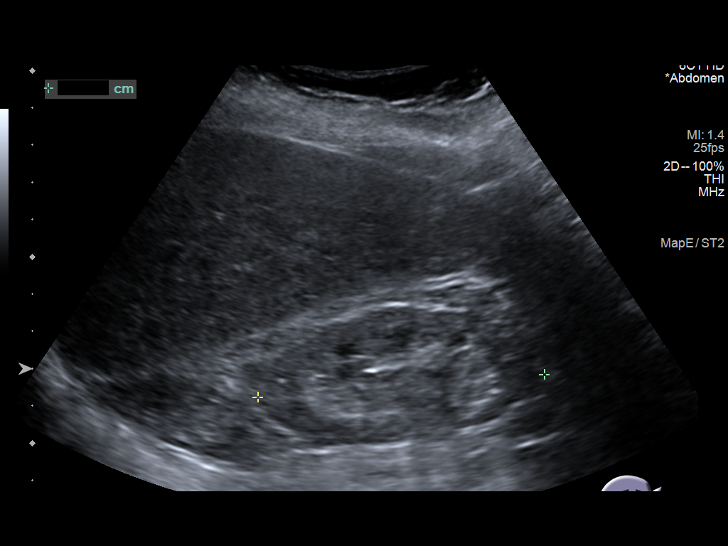
[im 151/226]
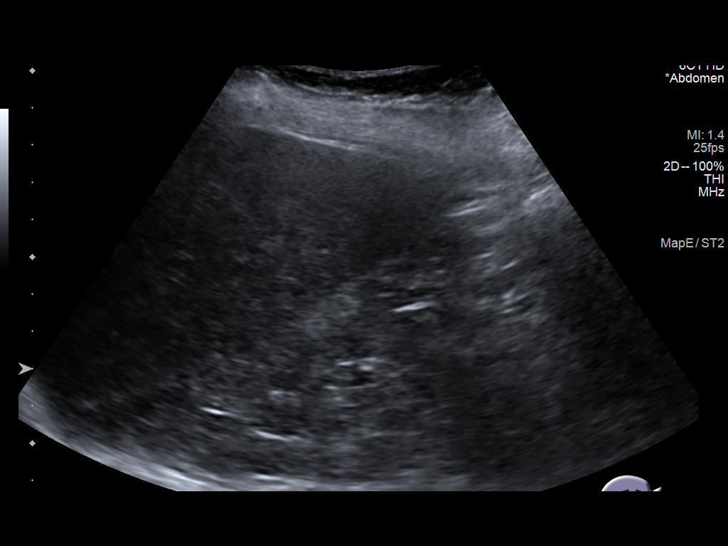
[im 169/226]
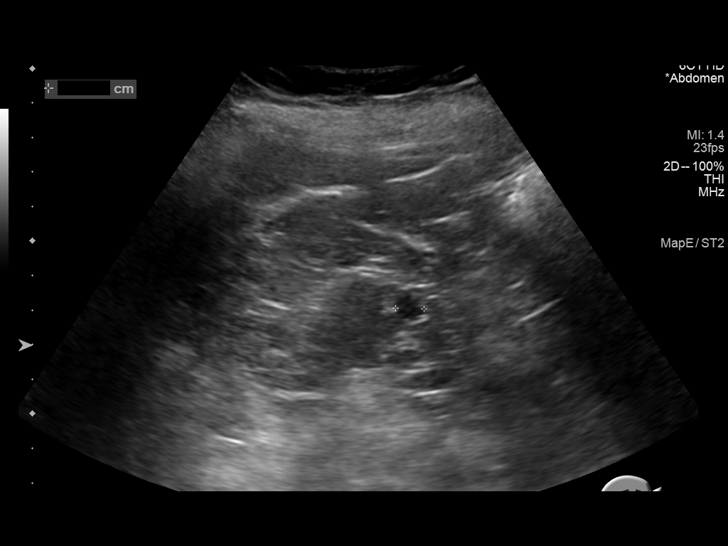
[im 188/226]
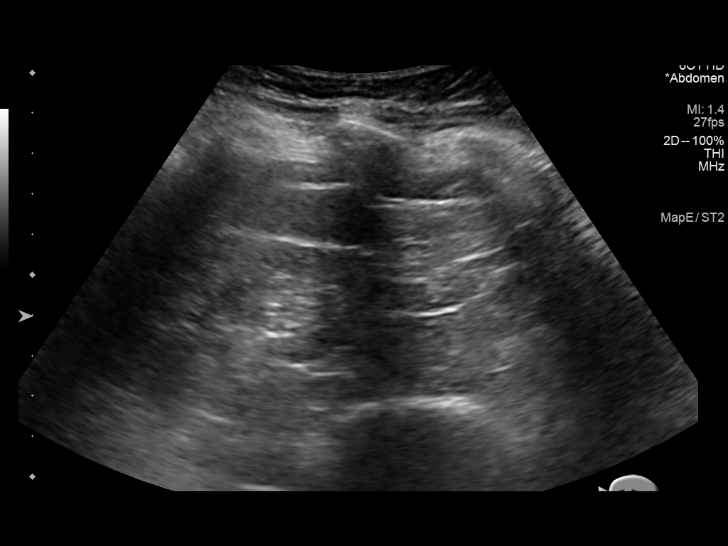
[im 207/226]
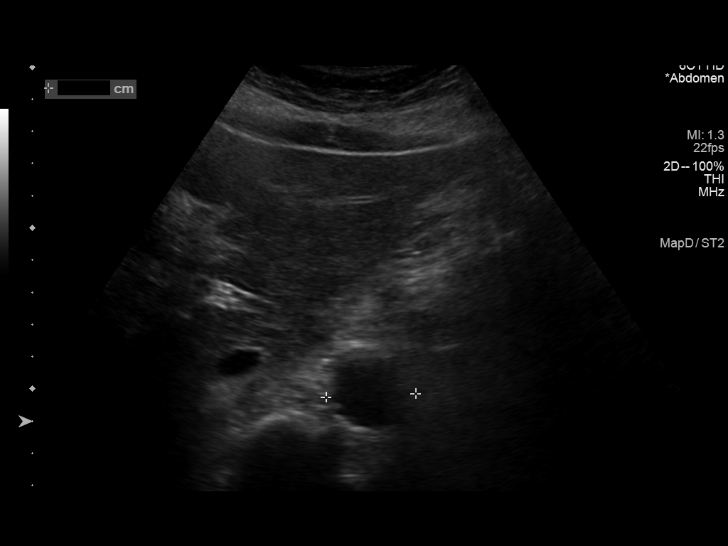
[im 226/226]
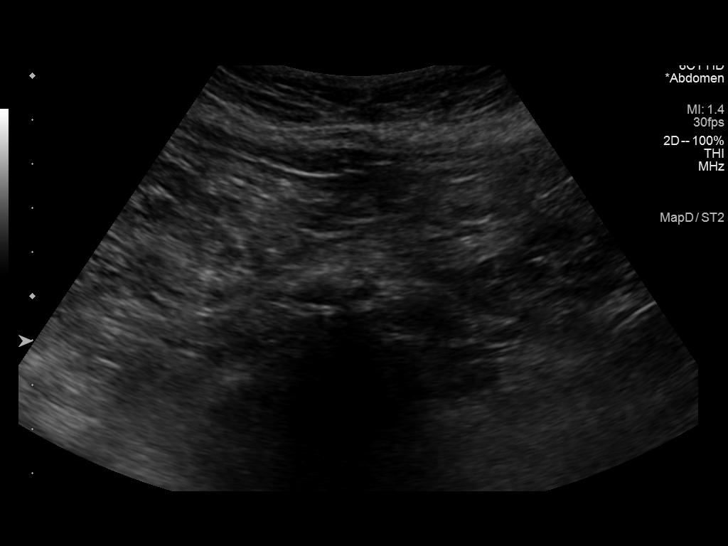

[14 of 25 positions shown; findings below may reference images not displayed]

FINDINGS: Gallbladder: No gallstones or wall thickening visualized. No
sonographic Murphy sign noted by sonographer.

Common bile duct: Diameter: 2.5 mm

Liver: Several benign-appearing cysts within liver. Normal hepatic
echogenicity. Portal vein is patent on color Doppler imaging with
normal direction of blood flow towards the liver.

IVC: No abnormality visualized.

Pancreas: Visualized portion unremarkable.

Spleen: Size and appearance within normal limits.

Right Kidney: Length: 7.7 cm. Benign-appearing anechoic cyst.
Increased echogenicity. No hydronephrosis

Left Kidney: Length: 8.7 cm. Increased echogenicity. No
hydronephrosis.

Abdominal aorta: No aneurysm visualized.

Other findings: No ascites
IMPRESSION: 1. No acute abdominal findings.
2. Increased renal echogenicity could indicate medical renal
disease. No hydronephrosis.

## 2021-06-15 ENCOUNTER — Other Ambulatory Visit (HOSPITAL_COMMUNITY): Payer: Self-pay | Admitting: Internal Medicine

## 2021-06-15 ENCOUNTER — Other Ambulatory Visit: Payer: Self-pay

## 2021-06-15 ENCOUNTER — Ambulatory Visit (HOSPITAL_COMMUNITY)
Admission: RE | Admit: 2021-06-15 | Discharge: 2021-06-15 | Disposition: A | Discharge status: Home / Self Care | Payer: Medicare Other | Source: Ambulatory Visit | Attending: Internal Medicine | Admitting: Internal Medicine

## 2021-06-15 DIAGNOSIS — J069 Acute upper respiratory infection, unspecified: Secondary | ICD-10-CM

## 2021-06-15 DIAGNOSIS — J9811 Atelectasis: Secondary | ICD-10-CM | POA: Diagnosis not present

## 2021-06-15 DIAGNOSIS — I517 Cardiomegaly: Secondary | ICD-10-CM | POA: Diagnosis not present

## 2021-06-15 DIAGNOSIS — J90 Pleural effusion, not elsewhere classified: Secondary | ICD-10-CM | POA: Diagnosis not present

## 2021-06-15 DIAGNOSIS — R110 Nausea: Secondary | ICD-10-CM | POA: Diagnosis not present

## 2021-09-07 ENCOUNTER — Telehealth: Payer: Self-pay | Admitting: *Deleted

## 2021-09-07 NOTE — Telephone Encounter (Signed)
Pt daughter stopped by the office to inform staff about patient. States that pt goes by [place]. Was seen in the past by Dr. Tenny Craw. Pt has a statin intolerance. She was seen in July for GI upset. Daughter reports that pt does not want to have a stress test, but is ok with noninvasive testing. Pt has swelling to left ankle and has lost weight. She is living in poor living conditions and consumes take out daily. Pt is also weak. Pt had EKG done on Aug. 25 and reviewed by cardiology PA that was noted to normal with left anterior fasicular block. Daughter is requesting that an ECHO be done. Daughter states that pt is a poor historian.

## 2021-09-07 NOTE — Progress Notes (Signed)
Cardiology Office Note:    Date:  09/08/2021   ID:  [name] 07-05-1946, MRN [id number]  PCP:  Benita Stabile, MD  Cardiologist:  None  Electrophysiologist:  None   Referring MD: Benita Stabile, MD   Chief Complaint  Patient presents with   Shortness of Breath   History of Present Illness:    [name] is a 75 y.o. female with a hx of hypertension, nonobstructive CAD, hyperlipidemia who presents for follow-up.  She reports she has been having dyspnea for last several months.  States that she is feels short of breath with minimal exertion.  Reports had episode of what she thinks was gastritis in July 2022, which he describes as pain in her upper abdomen and chest, but no chest pain since that time.  She denies any lightheadedness, syncope, or palpitations.  Has noted swelling in her left leg.  She smoked for over 40 years, quit in 2009.  Echocardiogram 06/27/2012 showed EF 65 to 70%, normal RV function, no significant valvular disease.  Carotid duplex 08/2015 showed 1 to 39% bilateral internal carotid artery stenosis.  Past Medical History:  Diagnosis Date   Cataract of right eye    Essential hypertension, benign    History of cardiac catheterization    Reportedly no significant CAD 1998 - previously followed with Dr. Corinda Gubler   Mixed hyperlipidemia    Osteoporosis    Recurrent UTI     Past Surgical History:  Procedure Laterality Date   ABDOMINAL HYSTERECTOMY     CATARACT EXTRACTION W/PHACO  09/17/2011   Procedure: CATARACT EXTRACTION PHACO AND INTRAOCULAR LENS PLACEMENT (IOC);  Surgeon: Gemma Payor;  Location: [facility];  Service: Ophthalmology;  Laterality: Right;  CDE:23.52   CYSTOSCOPY     FOOT SURGERY     LIPOMA EXCISION     Back    Current Medications: Current Meds  Medication Sig   albuterol (VENTOLIN HFA) 108 (90 Base) MCG/ACT inhaler albuterol sulfate HFA 90 mcg/actuation aerosol inhaler   ibuprofen (ADVIL) 100 MG/5ML suspension Take by mouth.    trimethoprim (TRIMPEX) 100 MG tablet Take 100 mg by mouth daily. Take one-half tablet daily     Allergies:   Eggs or egg-derived products, Lac bovis, Milk-related compounds, Sulfa antibiotics, and Sulfamethoxazole   Social History   Socioeconomic History   Marital status: Divorced    Spouse name: Not on file   Number of children: Not on file   Years of education: Not on file   Highest education level: Not on file  Occupational History   Not on file  Tobacco Use   Smoking status: Former    Types: Cigarettes   Smokeless tobacco: Never  Vaping Use   Vaping Use: Never used  Substance and Sexual Activity   Alcohol use: No   Drug use: No   Sexual activity: Not on file  Other Topics Concern   Not on file  Social History Narrative   Not on file   Social Determinants of Health   Financial Resource Strain: Not on file  Food Insecurity: Not on file  Transportation Needs: Not on file  Physical Activity: Not on file  Stress: Not on file  Social Connections: Not on file     Family History: The patient's family history includes Cancer in her father and mother; Diabetes in her brother; Hypertension in her brother and mother; Stroke in her brother and mother.  ROS:   Please see the history of present illness.  All other systems reviewed and are negative.  EKGs/Labs/Other Studies Reviewed:    The following studies were reviewed today:   EKG:  EKG is  ordered today.  The ekg ordered today demonstrates sinnus tachycardia, rate 103, LAD, nonspecific T wave flattening  Recent Labs: No results found for requested labs within last 8760 hours.  Recent Lipid Panel No results found for: CHOL, TRIG, HDL, CHOLHDL, VLDL, LDLCALC, LDLDIRECT  Physical Exam:    VS:  BP 138/72   Pulse (!) 103   Ht 5\' 4"  (1.626 m)   Wt 134 lb (60.8 kg)   SpO2 95%   BMI 23.00 kg/m     Wt Readings from Last 3 Encounters:  09/08/21 134 lb (60.8 kg)  03/24/18 142 lb 12.8 oz (64.8 kg)  03/22/17 145  lb (65.8 kg)     GEN:  in no acute distress HEENT: Normal NECK: No JVD CARDIAC: RRR, no murmurs, rubs, gallops RESPIRATORY:  Clear to auscultation without rales, wheezing or rhonchi  ABDOMEN: Soft, non-tender, non-distended MUSCULOSKELETAL:  Trace edema NEUROLOGIC:  Alert and oriented x 3 PSYCHIATRIC:  Normal affect   ASSESSMENT:    1. SOB (shortness of breath)   2. Atherosclerosis of native coronary artery of native heart, unspecified whether angina present   3. Hyperlipidemia, unspecified hyperlipidemia type    PLAN:     Dyspnea: Check echocardiogram to rule out structural heart disease.  Will check BNP, CMP, CBC, TSH.  Hyperlipidemia: Refuses any medications. LDL 181 on most recent labs.  Will check lipid panel  Carotid stenosis: Carotid duplex 08/2015 showed 1 to 39% bilateral internal carotid artery stenosis.  Nonobstructive CAD: Remote cath with nonobstructive CAD.  If work-up otherwise unremarkable, would consider ischemia evaluation as cause of her dyspnea.  She wishes to hold off at this time.  RTC in 1 month   Medication Adjustments/Labs and Tests Ordered: Current medicines are reviewed at length with the patient today.  Concerns regarding medicines are outlined above.  Orders Placed This Encounter  Procedures   B Nat Peptide   Comprehensive metabolic panel   CBC   TSH   Lipid Profile   EKG 12-Lead   No orders of the defined types were placed in this encounter.   Patient Instructions  Medication Instructions:  Your physician recommends that you continue on your current medications as directed. Please refer to the Current Medication list given to you today.   Labwork: bnp,cmet,cbc,tsh,lipids   Testing/Procedures: Your physician has requested that you have an echocardiogram. Echocardiography is a painless test that uses sound waves to create images of your heart. It provides your doctor with information about the size and shape of your heart and how  well your heart's chambers and valves are working. This procedure takes approximately one hour. There are no restrictions for this procedure.   Follow-Up: 1 month with MD  Any Other Special Instructions Will Be Listed Below (If Applicable).  If you need a refill on your cardiac medications before your next appointment, please call your pharmacy.   Signed, 09/2015, MD  09/08/2021 10:06 PM    [facility]

## 2021-09-08 ENCOUNTER — Other Ambulatory Visit: Payer: Self-pay

## 2021-09-08 ENCOUNTER — Ambulatory Visit: Payer: Medicare Other | Admitting: Cardiology

## 2021-09-08 ENCOUNTER — Encounter: Payer: Self-pay | Admitting: Cardiology

## 2021-09-08 VITALS — BP 138/72 | HR 103 | Ht 64.0 in | Wt 134.0 lb

## 2021-09-08 DIAGNOSIS — I2510 Atherosclerotic heart disease of native coronary artery without angina pectoris: Secondary | ICD-10-CM | POA: Diagnosis not present

## 2021-09-08 DIAGNOSIS — R0602 Shortness of breath: Secondary | ICD-10-CM

## 2021-09-08 DIAGNOSIS — E785 Hyperlipidemia, unspecified: Secondary | ICD-10-CM | POA: Diagnosis not present

## 2021-09-08 NOTE — Patient Instructions (Signed)
Medication Instructions:  Your physician recommends that you continue on your current medications as directed. Please refer to the Current Medication list given to you today.   Labwork: bnp,cmet,cbc,tsh,lipids   Testing/Procedures: Your physician has requested that you have an echocardiogram. Echocardiography is a painless test that uses sound waves to create images of your heart. It provides your doctor with information about the size and shape of your heart and how well your heart's chambers and valves are working. This procedure takes approximately one hour. There are no restrictions for this procedure.   Follow-Up: 1 month with MD  Any Other Special Instructions Will Be Listed Below (If Applicable).  If you need a refill on your cardiac medications before your next appointment, please call your pharmacy.

## 2021-09-12 ENCOUNTER — Other Ambulatory Visit: Payer: Self-pay

## 2021-09-12 ENCOUNTER — Other Ambulatory Visit (HOSPITAL_COMMUNITY)
Admission: RE | Admit: 2021-09-12 | Discharge: 2021-09-12 | Disposition: A | Discharge status: Home / Self Care | Payer: Medicare Other | Source: Ambulatory Visit | Attending: Cardiology | Admitting: Cardiology

## 2021-09-12 DIAGNOSIS — I2510 Atherosclerotic heart disease of native coronary artery without angina pectoris: Secondary | ICD-10-CM | POA: Diagnosis not present

## 2021-09-12 DIAGNOSIS — R0602 Shortness of breath: Secondary | ICD-10-CM | POA: Diagnosis not present

## 2021-09-12 DIAGNOSIS — E785 Hyperlipidemia, unspecified: Secondary | ICD-10-CM | POA: Diagnosis not present

## 2021-09-12 LAB — CBC
HCT: 33.1 % — ABNORMAL LOW (ref 36.0–46.0)
Hemoglobin: 10.3 g/dL — ABNORMAL LOW (ref 12.0–15.0)
MCH: 29.4 pg (ref 26.0–34.0)
MCHC: 31.1 g/dL (ref 30.0–36.0)
MCV: 94.6 fL (ref 80.0–100.0)
Platelets: 172 10*3/uL (ref 150–400)
RBC: 3.5 MIL/uL — ABNORMAL LOW (ref 3.87–5.11)
RDW: 15.6 % — ABNORMAL HIGH (ref 11.5–15.5)
WBC: 4.3 10*3/uL (ref 4.0–10.5)
nRBC: 0 % (ref 0.0–0.2)

## 2021-09-12 LAB — COMPREHENSIVE METABOLIC PANEL
ALT: 7 U/L (ref 0–44)
AST: 16 U/L (ref 15–41)
Albumin: 3.5 g/dL (ref 3.5–5.0)
Alkaline Phosphatase: 74 U/L (ref 38–126)
Anion gap: 7 (ref 5–15)
BUN: 15 mg/dL (ref 8–23)
CO2: 28 mmol/L (ref 22–32)
Calcium: 9.2 mg/dL (ref 8.9–10.3)
Chloride: 101 mmol/L (ref 98–111)
Creatinine, Ser: 1.77 mg/dL — ABNORMAL HIGH (ref 0.44–1.00)
GFR, Estimated: 29 mL/min — ABNORMAL LOW (ref >60)
Glucose, Bld: 97 mg/dL (ref 70–99)
Potassium: 3.5 mmol/L (ref 3.5–5.1)
Sodium: 136 mmol/L (ref 135–145)
Total Bilirubin: 0.6 mg/dL (ref 0.3–1.2)
Total Protein: 6.5 g/dL (ref 6.5–8.1)

## 2021-09-12 LAB — LIPID PANEL
Cholesterol: 173 mg/dL (ref 0–200)
HDL: 58 mg/dL (ref >40)
LDL Cholesterol: 99 mg/dL (ref 0–99)
Total CHOL/HDL Ratio: 3 RATIO
Triglycerides: 78 mg/dL (ref <150)
VLDL: 16 mg/dL (ref 0–40)

## 2021-09-12 LAB — TSH: TSH: 1.873 u[IU]/mL (ref 0.350–4.500)

## 2021-09-12 LAB — BRAIN NATRIURETIC PEPTIDE: B Natriuretic Peptide: 2321 pg/mL — ABNORMAL HIGH (ref 0.0–100.0)

## 2021-09-19 ENCOUNTER — Other Ambulatory Visit: Payer: Self-pay | Admitting: *Deleted

## 2021-09-19 ENCOUNTER — Encounter: Payer: Self-pay | Admitting: Cardiology

## 2021-09-19 ENCOUNTER — Telehealth: Payer: Self-pay | Admitting: Cardiology

## 2021-09-19 DIAGNOSIS — R0602 Shortness of breath: Secondary | ICD-10-CM

## 2021-09-19 NOTE — Telephone Encounter (Signed)
Pt is returning a call to triage.

## 2021-09-19 NOTE — Telephone Encounter (Signed)
Attempt to call patient and daughter (EC)-left message to call back in regards to recent results and recommendations  Echo scheduled 11/30 Appt with Dr. Bjorn Pippin 12/8

## 2021-09-20 ENCOUNTER — Other Ambulatory Visit: Payer: Self-pay

## 2021-09-20 ENCOUNTER — Ambulatory Visit (HOSPITAL_COMMUNITY)
Admission: RE | Admit: 2021-09-20 | Discharge: 2021-09-20 | Disposition: A | Discharge status: Home / Self Care | Payer: Medicare Other | Source: Ambulatory Visit | Attending: Internal Medicine | Admitting: Internal Medicine

## 2021-09-20 ENCOUNTER — Ambulatory Visit (HOSPITAL_COMMUNITY)
Admission: RE | Admit: 2021-09-20 | Discharge: 2021-09-20 | Disposition: A | Discharge status: Home / Self Care | Payer: Medicare Other | Source: Ambulatory Visit | Attending: Cardiology | Admitting: Cardiology

## 2021-09-20 DIAGNOSIS — R0602 Shortness of breath: Secondary | ICD-10-CM | POA: Diagnosis not present

## 2021-09-20 DIAGNOSIS — R079 Chest pain, unspecified: Secondary | ICD-10-CM | POA: Diagnosis not present

## 2021-09-20 DIAGNOSIS — J9811 Atelectasis: Secondary | ICD-10-CM | POA: Diagnosis not present

## 2021-09-20 DIAGNOSIS — J90 Pleural effusion, not elsewhere classified: Secondary | ICD-10-CM | POA: Diagnosis not present

## 2021-09-20 LAB — ECHOCARDIOGRAM COMPLETE
AR max vel: 1.66 cm2
AV Area VTI: 1.33 cm2
AV Area mean vel: 1.74 cm2
AV Mean grad: 2 mmHg
AV Peak grad: 4.4 mmHg
Ao pk vel: 1.05 m/s
Area-P 1/2: 7.66 cm2
MV M vel: 5.21 m/s
MV Peak grad: 108.6 mmHg
Radius: 0.7 cm
S' Lateral: 4.3 cm
Single Plane A2C EF: 26.4 %

## 2021-09-20 NOTE — Addendum Note (Signed)
Addended by: Kerney Elbe on: 09/20/2021 08:58 AM   Modules accepted: Orders

## 2021-09-20 NOTE — Progress Notes (Signed)
*  PRELIMINARY RESULTS* Echocardiogram 2D Echocardiogram has been performed. Patient declined Definity at present time. Is willing to come back if Definity is needed.  Stacey Drain 09/20/2021, 3:04 PM

## 2021-09-21 MED ORDER — FUROSEMIDE 40 MG PO TABS: 40.0000 mg | ORAL_TABLET | Freq: Every day | ORAL | 3 refills | Status: DC

## 2021-09-21 NOTE — Telephone Encounter (Signed)
Dr. Bjorn Pippin spoke to daughter-plan to keep appt 12/8 as scheduled.   Per MD: Start furosemide 40 mg daily  Rx sent to pharmacy.

## 2021-09-21 NOTE — Telephone Encounter (Signed)
Johney Frame, RN is speaking with daughter, Mickie Bail , thru MyChart.

## 2021-09-24 NOTE — H&P (View-Only) (Signed)
Cardiology Office Note:    Date:  09/28/2021   ID:  [name], [name] [birth year], MRN [id number]  PCP:  Benita Stabile, MD  Cardiologist:  None  Electrophysiologist:  None   Referring MD: Benita Stabile, MD   Chief Complaint  Patient presents with   Congestive Heart Failure    History of Present Illness:    [name] is a 75 y.o. female with a hx of hypertension, nonobstructive CAD, hyperlipidemia who presents for follow-up.  She was initially seen on 09/08/2021.  She reports she has been having dyspnea for last several months.  States that she is feels short of breath with minimal exertion.  Reports had episode of what she thinks was gastritis in July 2022, which he describes as pain in her upper abdomen and chest, but no chest pain since that time.  She denies any lightheadedness, syncope, or palpitations.  Has noted swelling in her left leg.  She smoked for over 40 years, quit in 2009.  Echocardiogram 06/27/2012 showed EF 65 to 70%, normal RV function, no significant valvular disease.  Carotid duplex 08/2015 showed 1 to 39% bilateral internal carotid artery stenosis.  Echocardiogram on 09/20/2021 showed EF 20 to 25%, mild LV dilatation, moderate RV dysfunction, RVSP 61 mmHg, severe left atrial enlargement, small pericardial effusion, severe MR, mild AI.  Labs on 09/12/2021 showed creatinine 1.77 (increased from 1.55 in August), BNP 2321, hemoglobin 10.3.  Given her severe systolic heart failure and need for diuresis and tenuous kidney function, recommended admission.  She declined.    She was started on Lasix 40 mg daily but reports she has only been taking 20 mg daily.  She has lost 10 pounds since starting this.  Reports her dyspnea has improved.  She denies any chest pain.  Reports some lightheadedness but denies any syncope.  Wt Readings from Last 3 Encounters:  09/28/21 125 lb 6.4 oz (56.9 kg)  09/08/21 134 lb (60.8 kg)  03/24/18 142 lb 12.8 oz (64.8 kg)     Past Medical  History:  Diagnosis Date   Cataract of right eye    Essential hypertension, benign    History of cardiac catheterization    Reportedly no significant CAD 1998 - previously followed with Dr. Corinda Gubler   Mixed hyperlipidemia    Osteoporosis    Recurrent UTI     Past Surgical History:  Procedure Laterality Date   ABDOMINAL HYSTERECTOMY     CATARACT EXTRACTION W/PHACO  09/17/2011   Procedure: CATARACT EXTRACTION PHACO AND INTRAOCULAR LENS PLACEMENT (IOC);  Surgeon: Gemma Payor;  Location: [facility];  Service: Ophthalmology;  Laterality: Right;  CDE:23.52   CYSTOSCOPY     FOOT SURGERY     LIPOMA EXCISION     Back    Current Medications: Current Meds  Medication Sig   Cholecalciferol 25 MCG (1000 UT) tablet Take by mouth.   furosemide (LASIX) 40 MG tablet Take 1 tablet (40 mg total) by mouth daily.   ibuprofen (ADVIL) 100 MG/5ML suspension Take by mouth.   ondansetron (ZOFRAN) 4 MG tablet Take 4 mg by mouth every 6 (six) hours as needed.   trimethoprim (TRIMPEX) 100 MG tablet Take 100 mg by mouth daily. Take one-half tablet daily     Allergies:   Eggs or egg-derived products, Lac bovis, Milk-related compounds, Sulfa antibiotics, and Sulfamethoxazole   Social History   Socioeconomic History   Marital status: Divorced    Spouse name: Not on file   Number of  children: Not on file   Years of education: Not on file   Highest education level: Not on file  Occupational History   Not on file  Tobacco Use   Smoking status: Former    Types: Cigarettes   Smokeless tobacco: Never  Vaping Use   Vaping Use: Never used  Substance and Sexual Activity   Alcohol use: No   Drug use: No   Sexual activity: Not on file  Other Topics Concern   Not on file  Social History Narrative   Not on file   Social Determinants of Health   Financial Resource Strain: Not on file  Food Insecurity: Not on file  Transportation Needs: Not on file  Physical Activity: Not on file  Stress: Not on file   Social Connections: Not on file     Family History: The patient's family history includes Cancer in her father and mother; Diabetes in her brother; Hypertension in her brother and mother; Stroke in her brother and mother.  ROS:   Please see the history of present illness.     All other systems reviewed and are negative.  EKGs/Labs/Other Studies Reviewed:    The following studies were reviewed today:   EKG:  EKG is  not ordered today.  The ekg ordered at prior clinic visit demonstrates sinnus tachycardia, rate 103, LAD, nonspecific T wave flattening  Recent Labs: 09/12/2021: ALT 7; B Natriuretic Peptide 2,321.0; BUN 15; Creatinine, Ser 1.77; Hemoglobin 10.3; Platelets 172; Potassium 3.5; Sodium 136; TSH 1.873  Recent Lipid Panel    Component Value Date/Time   CHOL 173 09/12/2021 1128   TRIG 78 09/12/2021 1128   HDL 58 09/12/2021 1128   CHOLHDL 3.0 09/12/2021 1128   VLDL 16 09/12/2021 1128   LDLCALC 99 09/12/2021 1128    Physical Exam:    VS:  BP 110/78    Pulse 88    Ht 5\' 4"  (1.626 m)    Wt 125 lb 6.4 oz (56.9 kg)    SpO2 97%    BMI 21.52 kg/m     Wt Readings from Last 3 Encounters:  09/28/21 125 lb 6.4 oz (56.9 kg)  09/08/21 134 lb (60.8 kg)  03/24/18 142 lb 12.8 oz (64.8 kg)     GEN:  in no acute distress HEENT: Normal NECK: No JVD CARDIAC: RRR, no murmurs, rubs, gallops RESPIRATORY:  Clear to auscultation without rales, wheezing or rhonchi  ABDOMEN: Soft, non-tender, non-distended MUSCULOSKELETAL:  Trace edema NEUROLOGIC:  Alert and oriented x 3 PSYCHIATRIC:  Normal affect   ASSESSMENT:    1. Acute combined systolic (congestive) and diastolic (congestive) heart failure ([place])   2. Pre-op testing   3. Hyperlipidemia, unspecified hyperlipidemia type   4. Stage 3b chronic kidney disease (HCC)     PLAN:     Acute combined systolic and diastolic heart failure: Echocardiogram on 09/20/2021 showed EF 20 to 25%, mild LV dilatation, moderate RV dysfunction,  RVSP 61 mmHg, severe left atrial enlargement, small pericardial effusion, severe MR, mild AI -Continue Lasix, has lost 10 pounds and reports resolution of dyspnea.  Will check BMP -Recommend LHC/RHC.  Risks and benefits of cardiac catheterization have been discussed with the patient.  These include bleeding, infection, kidney damage, stroke, heart attack, death.  The patient understands these risks and is willing to proceed. -Plan to start losartan if stable renal function post cath -Plan to start beta-blocker following cath, provided not low output on RHC  CKD stage IIIb: Creatinine 1.55 from records  from PCP in August.  1.77 on 09/12/2021.  We will need to closely monitor post heart catheterization as above.  We will repeat BMP  Hyperlipidemia: Refuses any medications. LDL 181 on prior labs, improved to 99 on 09/12/21 without any intervention  Carotid stenosis: Carotid duplex 08/2015 showed 1 to 39% bilateral internal carotid artery stenosis.   RTC in 1 month    Medication Adjustments/Labs and Tests Ordered: Current medicines are reviewed at length with the patient today.  Concerns regarding medicines are outlined above.  Orders Placed This Encounter  Procedures   CBC   Basic metabolic panel   CBC   Basic metabolic panel    No orders of the defined types were placed in this encounter.   Patient Instructions   [place] [address] Dept: [phone] Loc: [place]  09/28/2021  You are scheduled for a Cardiac Catheterization on Tuesday, December 19 with Dr. Lauree Chandler.  1. Please arrive at the [facility] (Main Entrance A) at [facility]: [address] [place], [place] [address] at 8:30 AM (This time is two hours before your procedure to ensure your preparation). Free valet parking service is available.   Special note: Every effort is  made to have your procedure done on time. Please understand that emergencies sometimes delay scheduled procedures.  2. Diet: Do not eat solid foods after midnight.  The patient may have clear liquids until 5am upon the day of the procedure.  3. Labs: You will need to have blood drawn as soon as possible at [facility], You do not need to be fasting.  4. Medication instructions in preparation for your procedure:   Contrast Allergy: No   HOLD Lasix day of cath 10/09/21    On the morning of your procedure, take your Aspirin 81 mg and any morning medicines NOT listed above.  You may use sips of water.  5. Plan for one night stay--bring personal belongings. 6. Bring a current list of your medications and current insurance cards. 7. You MUST have a responsible person to drive you home. 8. Someone MUST be with you the first 24 hours after you arrive home or your discharge will be delayed. 9. Please wear clothes that are easy to get on and off and wear slip-on shoes.  Thank you for allowing [place] to care for you!   -- [facility] Invasive Cardiovascular services   Signed, Donato Heinz, MD  09/28/2021 10:28 PM    [place]

## 2021-09-24 NOTE — Progress Notes (Signed)
Cardiology Office Note:    Date:  09/28/2021   ID:  [name], [name] 1946/04/29, MRN [id number]  PCP:  Celene Squibb, MD  Cardiologist:  None  Electrophysiologist:  None   Referring MD: Celene Squibb, MD   Chief Complaint  Patient presents with   Congestive Heart Failure    History of Present Illness:    [name] is a 75 y.o. female with a hx of hypertension, nonobstructive CAD, hyperlipidemia who presents for follow-up.  She was initially seen on 09/08/2021.  She reports she has been having dyspnea for last several months.  States that she is feels short of breath with minimal exertion.  Reports had episode of what she thinks was gastritis in July 2022, which he describes as pain in her upper abdomen and chest, but no chest pain since that time.  She denies any lightheadedness, syncope, or palpitations.  Has noted swelling in her left leg.  She smoked for over 40 years, quit in 2009.  Echocardiogram 06/27/2012 showed EF 65 to 70%, normal RV function, no significant valvular disease.  Carotid duplex 08/2015 showed 1 to 39% bilateral internal carotid artery stenosis.  Echocardiogram on 09/20/2021 showed EF 20 to 25%, mild LV dilatation, moderate RV dysfunction, RVSP 61 mmHg, severe left atrial enlargement, small pericardial effusion, severe MR, mild AI.  Labs on 09/12/2021 showed creatinine 1.77 (increased from 1.55 in August), BNP 2321, hemoglobin 10.3.  Given her severe systolic heart failure and need for diuresis and tenuous kidney function, recommended admission.  She declined.    She was started on Lasix 40 mg daily but reports she has only been taking 20 mg daily.  She has lost 10 pounds since starting this.  Reports her dyspnea has improved.  She denies any chest pain.  Reports some lightheadedness but denies any syncope.  Wt Readings from Last 3 Encounters:  09/28/21 125 lb 6.4 oz (56.9 kg)  09/08/21 134 lb (60.8 kg)  03/24/18 142 lb 12.8 oz (64.8 kg)     Past Medical  History:  Diagnosis Date   Cataract of right eye    Essential hypertension, benign    History of cardiac catheterization    Reportedly no significant CAD 1998 - previously followed with Dr. Velora Heckler   Mixed hyperlipidemia    Osteoporosis    Recurrent UTI     Past Surgical History:  Procedure Laterality Date   ABDOMINAL HYSTERECTOMY     CATARACT EXTRACTION W/PHACO  09/17/2011   Procedure: CATARACT EXTRACTION PHACO AND INTRAOCULAR LENS PLACEMENT (IOC);  Surgeon: Tonny Branch;  Location: [facility];  Service: Ophthalmology;  Laterality: Right;  CDE:23.52   CYSTOSCOPY     FOOT SURGERY     LIPOMA EXCISION     Back    Current Medications: Current Meds  Medication Sig   Cholecalciferol 25 MCG (1000 UT) tablet Take by mouth.   furosemide (LASIX) 40 MG tablet Take 1 tablet (40 mg total) by mouth daily.   ibuprofen (ADVIL) 100 MG/5ML suspension Take by mouth.   ondansetron (ZOFRAN) 4 MG tablet Take 4 mg by mouth every 6 (six) hours as needed.   trimethoprim (TRIMPEX) 100 MG tablet Take 100 mg by mouth daily. Take one-half tablet daily     Allergies:   Eggs or egg-derived products, Lac bovis, Milk-related compounds, Sulfa antibiotics, and Sulfamethoxazole   Social History   Socioeconomic History   Marital status: Divorced    Spouse name: Not on file   Number of  children: Not on file  ° Years of education: Not on file  ° Highest education level: Not on file  °Occupational History  ° Not on file  °Tobacco Use  ° Smoking status: Former  °  Types: Cigarettes  ° Smokeless tobacco: Never  °Vaping Use  ° Vaping Use: Never used  °Substance and Sexual Activity  ° Alcohol use: No  ° Drug use: No  ° Sexual activity: Not on file  °Other Topics Concern  ° Not on file  °Social History Narrative  ° Not on file  ° °Social Determinants of Health  ° °Financial Resource Strain: Not on file  °Food Insecurity: Not on file  °Transportation Needs: Not on file  °Physical Activity: Not on file  °Stress: Not on file   °Social Connections: Not on file  °  ° °Family History: °The patient's family history includes Cancer in her father and mother; Diabetes in her brother; Hypertension in her brother and mother; Stroke in her brother and mother. ° °ROS:   °Please see the history of present illness.    ° All other systems reviewed and are negative. ° °EKGs/Labs/Other Studies Reviewed:   ° °The following studies were reviewed today: ° ° °EKG:  EKG is  not ordered today.  The ekg ordered at prior clinic visit demonstrates sinnus tachycardia, rate 103, LAD, nonspecific T wave flattening ° °Recent Labs: °09/12/2021: ALT 7; B Natriuretic Peptide 2,321.0; BUN 15; Creatinine, Ser 1.77; Hemoglobin 10.3; Platelets 172; Potassium 3.5; Sodium 136; TSH 1.873  °Recent Lipid Panel °   °Component Value Date/Time  ° CHOL 173 09/12/2021 1128  ° TRIG 78 09/12/2021 1128  ° HDL 58 09/12/2021 1128  ° CHOLHDL 3.0 09/12/2021 1128  ° VLDL 16 09/12/2021 1128  ° LDLCALC 99 09/12/2021 1128  ° ° °Physical Exam:   ° °VS:  BP 110/78    Pulse 88    Ht 5' 4" (1.626 m)    Wt 125 lb 6.4 oz (56.9 kg)    SpO2 97%    BMI 21.52 kg/m²    ° °Wt Readings from Last 3 Encounters:  °09/28/21 125 lb 6.4 oz (56.9 kg)  °09/08/21 134 lb (60.8 kg)  °03/24/18 142 lb 12.8 oz (64.8 kg)  °  ° °GEN:  in no acute distress °HEENT: Normal °NECK: No JVD °CARDIAC: RRR, no murmurs, rubs, gallops °RESPIRATORY:  Clear to auscultation without rales, wheezing or rhonchi  °ABDOMEN: Soft, non-tender, non-distended °MUSCULOSKELETAL:  Trace edema °NEUROLOGIC:  Alert and oriented x 3 °PSYCHIATRIC:  Normal affect  ° °ASSESSMENT:   ° °1. Acute combined systolic (congestive) and diastolic (congestive) heart failure (HCC)   °2. Pre-op testing   °3. Hyperlipidemia, unspecified hyperlipidemia type   °4. Stage 3b chronic kidney disease (HCC)   ° ° °PLAN:   ° ° °Acute combined systolic and diastolic heart failure: Echocardiogram on 09/20/2021 showed EF 20 to 25%, mild LV dilatation, moderate RV dysfunction,  RVSP 61 mmHg, severe left atrial enlargement, small pericardial effusion, severe MR, mild AI °-Continue Lasix, has lost 10 pounds and reports resolution of dyspnea.  Will check BMP °-Recommend LHC/RHC.  Risks and benefits of cardiac catheterization have been discussed with the patient.  These include bleeding, infection, kidney damage, stroke, heart attack, death.  The patient understands these risks and is willing to proceed. °-Plan to start losartan if stable renal function post cath °-Plan to start beta-blocker following cath, provided not low output on RHC ° °CKD stage IIIb: Creatinine 1.55 from records   1.77 on 09/12/2021.  We will need to closely monitor post heart catheterization as above.  We will repeat BMP  Hyperlipidemia: Refuses any medications. LDL 181 on prior labs, improved to 99 on 09/12/21 without any intervention  Carotid stenosis: Carotid duplex 08/2015 showed 1 to 39% bilateral internal carotid artery stenosis.   RTC in 1 month    Medication Adjustments/Labs and Tests Ordered: Current medicines are reviewed at length with the patient today.  Concerns regarding medicines are outlined above.  Orders Placed This Encounter  Procedures   CBC   Basic metabolic panel   CBC   Basic metabolic panel    No orders of the defined types were placed in this encounter.   Patient Instructions   [place] [address] Dept: [phone] Loc: [place]  09/28/2021  You are scheduled for a Cardiac Catheterization on Tuesday, December 19 with Dr. Lauree Chandler.  1. Please arrive at the [facility] (Main Entrance A) at [facility]: [address] [place], [place] [address] at 8:30 AM (This time is two hours before your procedure to ensure your preparation). Free valet parking service is available.   Special note: Every effort is  made to have your procedure done on time. Please understand that emergencies sometimes delay scheduled procedures.  2. Diet: Do not eat solid foods after midnight.  The patient may have clear liquids until 5am upon the day of the procedure.  3. Labs: You will need to have blood drawn as soon as possible at [facility], You do not need to be fasting.  4. Medication instructions in preparation for your procedure:   Contrast Allergy: No   HOLD Lasix day of cath 10/09/21    On the morning of your procedure, take your Aspirin 81 mg and any morning medicines NOT listed above.  You may use sips of water.  5. Plan for one night stay--bring personal belongings. 6. Bring a current list of your medications and current insurance cards. 7. You MUST have a responsible person to drive you home. 8. Someone MUST be with you the first 24 hours after you arrive home or your discharge will be delayed. 9. Please wear clothes that are easy to get on and off and wear slip-on shoes.  Thank you for allowing [place] to care for you!   -- [facility] Invasive Cardiovascular services   Signed, Donato Heinz, MD  09/28/2021 10:28 PM    [place]

## 2021-09-25 ENCOUNTER — Telehealth: Payer: Self-pay | Admitting: Cardiology

## 2021-09-25 NOTE — Telephone Encounter (Signed)
See my chart message

## 2021-09-25 NOTE — Telephone Encounter (Signed)
Pt returning phone call... please advise  

## 2021-09-28 ENCOUNTER — Encounter: Payer: Self-pay | Admitting: Cardiology

## 2021-09-28 ENCOUNTER — Ambulatory Visit: Payer: Medicare Other | Admitting: Cardiology

## 2021-09-28 VITALS — BP 110/78 | HR 88 | Ht 64.0 in | Wt 125.4 lb

## 2021-09-28 DIAGNOSIS — Z01818 Encounter for other preprocedural examination: Secondary | ICD-10-CM | POA: Diagnosis not present

## 2021-09-28 DIAGNOSIS — I5041 Acute combined systolic (congestive) and diastolic (congestive) heart failure: Secondary | ICD-10-CM | POA: Diagnosis not present

## 2021-09-28 DIAGNOSIS — E785 Hyperlipidemia, unspecified: Secondary | ICD-10-CM

## 2021-09-28 DIAGNOSIS — N1832 Chronic kidney disease, stage 3b: Secondary | ICD-10-CM | POA: Diagnosis not present

## 2021-09-28 NOTE — Patient Instructions (Addendum)
  [facility] [place] [address] [place] [address] Dept: [phone] Loc: [phone]  KETRINA BOATENG  09/28/2021  You are scheduled for a Cardiac Catheterization on Tuesday, December 19 with Dr. Verne Carrow.  1. Please arrive at the [facility] (Main Entrance A) at [facility]: [address] [place], [place] [address] at 8:30 AM (This time is two hours before your procedure to ensure your preparation). Free valet parking service is available.   Special note: Every effort is made to have your procedure done on time. Please understand that emergencies sometimes delay scheduled procedures.  2. Diet: Do not eat solid foods after midnight.  The patient may have clear liquids until 5am upon the day of the procedure.  3. Labs: You will need to have blood drawn as soon as possible at [facility], You do not need to be fasting.  4. Medication instructions in preparation for your procedure:   Contrast Allergy: No   HOLD Lasix day of cath 10/09/21    On the morning of your procedure, take your Aspirin 81 mg and any morning medicines NOT listed above.  You may use sips of water.  5. Plan for one night stay--bring personal belongings. 6. Bring a current list of your medications and current insurance cards. 7. You MUST have a responsible person to drive you home. 8. Someone MUST be with you the first 24 hours after you arrive home or your discharge will be delayed. 9. Please wear clothes that are easy to get on and off and wear slip-on shoes.  Thank you for allowing [place] to care for you!   -- [place] Invasive Cardiovascular services

## 2021-10-05 ENCOUNTER — Other Ambulatory Visit (HOSPITAL_COMMUNITY)
Admission: RE | Admit: 2021-10-05 | Discharge: 2021-10-05 | Disposition: A | Discharge status: Home / Self Care | Payer: Medicare Other | Source: Ambulatory Visit | Attending: Cardiology | Admitting: Cardiology

## 2021-10-05 ENCOUNTER — Other Ambulatory Visit: Payer: Self-pay

## 2021-10-05 ENCOUNTER — Telehealth: Payer: Self-pay | Admitting: *Deleted

## 2021-10-05 DIAGNOSIS — Z01818 Encounter for other preprocedural examination: Secondary | ICD-10-CM | POA: Diagnosis not present

## 2021-10-05 DIAGNOSIS — I5041 Acute combined systolic (congestive) and diastolic (congestive) heart failure: Secondary | ICD-10-CM | POA: Diagnosis not present

## 2021-10-05 LAB — CBC
HCT: 38.2 % (ref 36.0–46.0)
Hemoglobin: 11.7 g/dL — ABNORMAL LOW (ref 12.0–15.0)
MCH: 29 pg (ref 26.0–34.0)
MCHC: 30.6 g/dL (ref 30.0–36.0)
MCV: 94.8 fL (ref 80.0–100.0)
Platelets: 224 10*3/uL (ref 150–400)
RBC: 4.03 MIL/uL (ref 3.87–5.11)
RDW: 15.6 % — ABNORMAL HIGH (ref 11.5–15.5)
WBC: 4.9 10*3/uL (ref 4.0–10.5)
nRBC: 0 % (ref 0.0–0.2)

## 2021-10-05 LAB — BASIC METABOLIC PANEL
Anion gap: 11 (ref 5–15)
BUN: 25 mg/dL — ABNORMAL HIGH (ref 8–23)
CO2: 33 mmol/L — ABNORMAL HIGH (ref 22–32)
Calcium: 9.4 mg/dL (ref 8.9–10.3)
Chloride: 92 mmol/L — ABNORMAL LOW (ref 98–111)
Creatinine, Ser: 2.16 mg/dL — ABNORMAL HIGH (ref 0.44–1.00)
GFR, Estimated: 23 mL/min — ABNORMAL LOW (ref >60)
Glucose, Bld: 98 mg/dL (ref 70–99)
Potassium: 2.8 mmol/L — ABNORMAL LOW (ref 3.5–5.1)
Sodium: 136 mmol/L (ref 135–145)

## 2021-10-05 MED ORDER — POTASSIUM CHLORIDE CRYS ER 20 MEQ PO TBCR: EXTENDED_RELEASE_TABLET | ORAL | 0 refills | Status: DC

## 2021-10-05 NOTE — Progress Notes (Signed)
Per secure chat from MD:  Suspect overdiuresis. Raynelle Fanning, could we call her and hold her lasix?  Would also give potassium chloride 40 meq daily for next 2 days while holding lasix.  Thurston Hole, could we have her come in early on Monday and recheck BMET to see if improved with holding lasix?  I called patient, made aware to stop lasix for now, calling potassium for the next two days, and advised of when to arrive at the hospital.   Patient verbalized understanding, request I send this to her mychart for her ride to know what is going on.

## 2021-10-05 NOTE — Telephone Encounter (Addendum)
See BMP results 10/05/21-Per Dr Bjorn Pippin: -Hold lasix starting today -Take KCl 20 mEq two tablets daily for 2 days -Arrive 8 AM on Monday December 19 -will get BMP on arrival to Short Stay  Above plan has been reviewed with patient.  Per Dr Bjorn Pippin: -Does not recommend arriving early for hydration prior to cath -Arrive just 30 minutes early should be fine, hopefully renal function back to baseline with holding diuretic. -Would do KVO fluids with her EF so low

## 2021-10-05 NOTE — Telephone Encounter (Addendum)
Cardiac catheterization scheduled at [facility] for: Monday October 09, 2021 10:30 AM [facility] Main Entrance A [facility]) at: 8 AM-needs BMP   Diet-no solid food after midnight prior to cath, clear liquids until 5 AM day of procedure.  Hold lasix (per Dr Bjorn Pippin) starting today-10/05/21. Hold ibuprofen per protocol.   Medication instructions for procedure: -Usual morning medications can be taken pre-cath with sips of water including aspirin 81 mg.    Confirmed patient has responsible adult to drive home post procedure and be with patient first 24 hours after arriving home.  [facility] does allow one visitor to accompany you and wait in the hospital waiting room while you are there for your procedure. You and your visitor will be asked to wear a mask once you enter the hospital.   Patient reports does not currently have any new symptoms concerning for COVID-19 and no household members with COVID-19 like illness.    Reviewed procedure/mask/visitor instructions reviewed with patient.

## 2021-10-06 NOTE — Telephone Encounter (Addendum)
See 09/19/21 MyChart message and 10/05/21 response to patient message-revised cath instructions sent to patient in that message

## 2021-10-09 ENCOUNTER — Ambulatory Visit (HOSPITAL_COMMUNITY)
Admission: RE | Disposition: A | Discharge status: Home / Self Care | Payer: Self-pay | Source: Home / Self Care | Attending: Cardiovascular Disease

## 2021-10-09 ENCOUNTER — Ambulatory Visit (HOSPITAL_COMMUNITY)
Admission: RE | Admit: 2021-10-09 | Discharge: 2021-10-09 | Disposition: A | Discharge status: Home / Self Care | Payer: Medicare Other | Attending: Cardiovascular Disease | Admitting: Cardiovascular Disease

## 2021-10-09 DIAGNOSIS — I5043 Acute on chronic combined systolic (congestive) and diastolic (congestive) heart failure: Secondary | ICD-10-CM | POA: Insufficient documentation

## 2021-10-09 DIAGNOSIS — N1832 Chronic kidney disease, stage 3b: Secondary | ICD-10-CM | POA: Insufficient documentation

## 2021-10-09 DIAGNOSIS — I5022 Chronic systolic (congestive) heart failure: Secondary | ICD-10-CM

## 2021-10-09 DIAGNOSIS — I130 Hypertensive heart and chronic kidney disease with heart failure and stage 1 through stage 4 chronic kidney disease, or unspecified chronic kidney disease: Secondary | ICD-10-CM | POA: Diagnosis not present

## 2021-10-09 DIAGNOSIS — E785 Hyperlipidemia, unspecified: Secondary | ICD-10-CM | POA: Insufficient documentation

## 2021-10-09 DIAGNOSIS — I6523 Occlusion and stenosis of bilateral carotid arteries: Secondary | ICD-10-CM | POA: Diagnosis not present

## 2021-10-09 DIAGNOSIS — I428 Other cardiomyopathies: Secondary | ICD-10-CM

## 2021-10-09 DIAGNOSIS — I2510 Atherosclerotic heart disease of native coronary artery without angina pectoris: Secondary | ICD-10-CM | POA: Diagnosis not present

## 2021-10-09 DIAGNOSIS — Z87891 Personal history of nicotine dependence: Secondary | ICD-10-CM | POA: Insufficient documentation

## 2021-10-09 HISTORY — PX: RIGHT/LEFT HEART CATH AND CORONARY ANGIOGRAPHY: CATH118266

## 2021-10-09 LAB — POCT I-STAT 7, (LYTES, BLD GAS, ICA,H+H)
Acid-Base Excess: 1 mmol/L (ref 0.0–2.0)
Bicarbonate: 25.7 mmol/L (ref 20.0–28.0)
Calcium, Ion: 1.17 mmol/L (ref 1.15–1.40)
HCT: 32 % — ABNORMAL LOW (ref 36.0–46.0)
Hemoglobin: 10.9 g/dL — ABNORMAL LOW (ref 12.0–15.0)
O2 Saturation: 91 %
Potassium: 4.1 mmol/L (ref 3.5–5.1)
Sodium: 136 mmol/L (ref 135–145)
TCO2: 27 mmol/L (ref 22–32)
pCO2 arterial: 41.4 mmHg (ref 32.0–48.0)
pH, Arterial: 7.401 (ref 7.350–7.450)
pO2, Arterial: 61 mmHg — ABNORMAL LOW (ref 83.0–108.0)

## 2021-10-09 LAB — POCT I-STAT EG7
Acid-Base Excess: 2 mmol/L (ref 0.0–2.0)
Bicarbonate: 27.9 mmol/L (ref 20.0–28.0)
Calcium, Ion: 1.23 mmol/L (ref 1.15–1.40)
HCT: 34 % — ABNORMAL LOW (ref 36.0–46.0)
Hemoglobin: 11.6 g/dL — ABNORMAL LOW (ref 12.0–15.0)
O2 Saturation: 65 %
Potassium: 4.2 mmol/L (ref 3.5–5.1)
Sodium: 135 mmol/L (ref 135–145)
TCO2: 29 mmol/L (ref 22–32)
pCO2, Ven: 47.5 mmHg (ref 44.0–60.0)
pH, Ven: 7.378 (ref 7.250–7.430)
pO2, Ven: 35 mmHg (ref 32.0–45.0)

## 2021-10-09 LAB — BASIC METABOLIC PANEL
Anion gap: 7 (ref 5–15)
BUN: 27 mg/dL — ABNORMAL HIGH (ref 8–23)
CO2: 29 mmol/L (ref 22–32)
Calcium: 9.5 mg/dL (ref 8.9–10.3)
Chloride: 97 mmol/L — ABNORMAL LOW (ref 98–111)
Creatinine, Ser: 2.05 mg/dL — ABNORMAL HIGH (ref 0.44–1.00)
GFR, Estimated: 25 mL/min — ABNORMAL LOW (ref >60)
Glucose, Bld: 95 mg/dL (ref 70–99)
Potassium: 4.6 mmol/L (ref 3.5–5.1)
Sodium: 133 mmol/L — ABNORMAL LOW (ref 135–145)

## 2021-10-09 SURGERY — RIGHT/LEFT HEART CATH AND CORONARY ANGIOGRAPHY
Anesthesia: LOCAL

## 2021-10-09 MED ORDER — MIDAZOLAM HCL 2 MG/2ML IJ SOLN
INTRAMUSCULAR | Status: AC
  Filled 2021-10-09: qty 2

## 2021-10-09 MED ORDER — SODIUM CHLORIDE 0.9 % IV SOLN: 250.0000 mL | INTRAVENOUS | Status: DC | PRN

## 2021-10-09 MED ORDER — VERAPAMIL HCL 2.5 MG/ML IV SOLN
INTRAVENOUS | Status: DC | PRN
  Administered 2021-10-09: 13:00:00 10 mL via INTRA_ARTERIAL

## 2021-10-09 MED ORDER — VERAPAMIL HCL 2.5 MG/ML IV SOLN
INTRAVENOUS | Status: AC
  Filled 2021-10-09: qty 2

## 2021-10-09 MED ORDER — ONDANSETRON HCL 4 MG/2ML IJ SOLN: 4.0000 mg | Freq: Four times a day (QID) | INTRAMUSCULAR | Status: DC | PRN

## 2021-10-09 MED ORDER — IOHEXOL 350 MG/ML SOLN
INTRAVENOUS | Status: DC | PRN
  Administered 2021-10-09: 13:00:00 70 mL

## 2021-10-09 MED ORDER — HEPARIN SODIUM (PORCINE) 1000 UNIT/ML IJ SOLN
INTRAMUSCULAR | Status: DC | PRN
  Administered 2021-10-09: 3000 [IU] via INTRAVENOUS

## 2021-10-09 MED ORDER — SODIUM CHLORIDE 0.9 % IV SOLN: INTRAVENOUS | Status: DC

## 2021-10-09 MED ORDER — FENTANYL CITRATE (PF) 100 MCG/2ML IJ SOLN
INTRAMUSCULAR | Status: DC | PRN
  Administered 2021-10-09: 25 ug via INTRAVENOUS

## 2021-10-09 MED ORDER — FENTANYL CITRATE (PF) 100 MCG/2ML IJ SOLN
INTRAMUSCULAR | Status: AC
  Filled 2021-10-09: qty 2

## 2021-10-09 MED ORDER — SODIUM CHLORIDE 0.9% FLUSH: 3.0000 mL | Freq: Two times a day (BID) | INTRAVENOUS | Status: DC

## 2021-10-09 MED ORDER — LABETALOL HCL 5 MG/ML IV SOLN: 10.0000 mg | INTRAVENOUS | Status: DC | PRN

## 2021-10-09 MED ORDER — SODIUM CHLORIDE 0.9% FLUSH: 3.0000 mL | INTRAVENOUS | Status: DC | PRN

## 2021-10-09 MED ORDER — LIDOCAINE HCL (PF) 1 % IJ SOLN
INTRAMUSCULAR | Status: AC
  Filled 2021-10-09: qty 30

## 2021-10-09 MED ORDER — LIDOCAINE HCL (PF) 1 % IJ SOLN
INTRAMUSCULAR | Status: DC | PRN
  Administered 2021-10-09 (×2): 2 mL

## 2021-10-09 MED ORDER — SODIUM CHLORIDE 0.9 % IV SOLN: INTRAVENOUS | Status: AC

## 2021-10-09 MED ORDER — HEPARIN (PORCINE) IN NACL 2000-0.9 UNIT/L-% IV SOLN
INTRAVENOUS | Status: DC | PRN
  Administered 2021-10-09: 13:00:00 1000 mL

## 2021-10-09 MED ORDER — ASPIRIN 81 MG PO CHEW: 81.0000 mg | CHEWABLE_TABLET | ORAL | Status: DC

## 2021-10-09 MED ORDER — HEPARIN SODIUM (PORCINE) 1000 UNIT/ML IJ SOLN
INTRAMUSCULAR | Status: AC
  Filled 2021-10-09: qty 10

## 2021-10-09 MED ORDER — MIDAZOLAM HCL 2 MG/2ML IJ SOLN
INTRAMUSCULAR | Status: DC | PRN
  Administered 2021-10-09: 1 mg via INTRAVENOUS

## 2021-10-09 MED ORDER — HEPARIN (PORCINE) IN NACL 1000-0.9 UT/500ML-% IV SOLN
INTRAVENOUS | Status: AC
  Filled 2021-10-09: qty 1000

## 2021-10-09 MED ORDER — ACETAMINOPHEN 325 MG PO TABS: 650.0000 mg | ORAL_TABLET | ORAL | Status: DC | PRN

## 2021-10-09 MED ORDER — HYDRALAZINE HCL 20 MG/ML IJ SOLN: 10.0000 mg | INTRAMUSCULAR | Status: DC | PRN

## 2021-10-09 SURGICAL SUPPLY — 16 items
CATH 5FR JL3.5 JR4 ANG PIG MP (CATHETERS) ×2 IMPLANT
CATH BALLN WEDGE 5F 110CM (CATHETERS) ×2 IMPLANT
CATH INFINITI 5 FR 3DRC (CATHETERS) ×2 IMPLANT
CATH INFINITI 5 FR AR1 MOD (CATHETERS) ×2 IMPLANT
DEVICE RAD COMP TR BAND LRG (VASCULAR PRODUCTS) ×2 IMPLANT
GLIDESHEATH SLEND SS 6F .021 (SHEATH) ×2 IMPLANT
GUIDEWIRE .025 260CM (WIRE) ×2 IMPLANT
GUIDEWIRE INQWIRE 1.5J.035X260 (WIRE) IMPLANT
INQWIRE 1.5J .035X260CM (WIRE) ×3
KIT HEART LEFT (KITS) ×3 IMPLANT
PACK CARDIAC CATHETERIZATION (CUSTOM PROCEDURE TRAY) ×3 IMPLANT
SHEATH GLIDE SLENDER 4/5FR (SHEATH) ×2 IMPLANT
SYR MEDRAD MARK 7 150ML (SYRINGE) ×3 IMPLANT
TRANSDUCER W/STOPCOCK (MISCELLANEOUS) ×3 IMPLANT
TUBING CIL FLEX 10 FLL-RA (TUBING) ×3 IMPLANT
WIRE HI TORQ VERSACORE-J 145CM (WIRE) ×2 IMPLANT

## 2021-10-09 NOTE — Interval H&P Note (Signed)
History and Physical Interval Note:  10/09/2021 9:38 AM  [name]  has presented today for surgery, with the diagnosis of chronic systolic heart failure.  The various methods of treatment have been discussed with the patient and family. After consideration of risks, benefits and other options for treatment, the patient has consented to  Procedure(s): RIGHT/LEFT HEART CATH AND CORONARY ANGIOGRAPHY (N/A) as a surgical intervention.  The patient's history has been reviewed, patient examined, no change in status, stable for surgery.  I have reviewed the patient's chart and labs.  Questions were answered to the patient's satisfaction.    Cath Lab Visit (complete for each Cath Lab visit)  Clinical Evaluation Leading to the Procedure:   ACS: No.  Non-ACS:    Anginal Classification: CCS II  Anti-ischemic medical therapy: No Therapy  Non-Invasive Test Results: No non-invasive testing performed  Prior CABG: No previous CABG        Verne Carrow

## 2021-10-10 ENCOUNTER — Encounter (HOSPITAL_COMMUNITY): Payer: Self-pay | Admitting: Cardiovascular Disease

## 2021-10-10 MED FILL — Heparin Sod (Porcine)-NaCl IV Soln 1000 Unit/500ML-0.9%: INTRAVENOUS | Qty: 1000 | Status: AC

## 2021-10-11 ENCOUNTER — Telehealth: Payer: Self-pay | Admitting: Cardiology

## 2021-10-11 NOTE — Telephone Encounter (Signed)
Called pt, went over the medications she is currently on per the medication list. She states the medications are not correct. Will get message over to Dr. Bjorn Pippin, to have the matter addressed. Pt had an appointment but hd to reschedule due to it being so early in the morning. She states "they overdosed me with the Lasix. They dehydrated me and all my numbers were out of wack. I do not want to take it until there is some kind of fluid. I need somebody, a PA or doctor to go over my medications since I will not see anyone until 2/9." Will relay message to Dr. Bjorn Pippin to make sure the pt is taking the proper medications.

## 2021-10-11 NOTE — Telephone Encounter (Signed)
Patient states she was discharged from the hospital and needs to know what medications she should be taking. She says the discharge paperwork does not mention anything.

## 2021-10-12 NOTE — Telephone Encounter (Signed)
Follow up scheduled

## 2021-10-12 NOTE — Telephone Encounter (Signed)
Left message for pt to call to reschedule follow up appointment.

## 2021-10-12 NOTE — Telephone Encounter (Signed)
Can we reschedule for 10/27/2020 for a time that works for her in [place]?

## 2021-10-24 ENCOUNTER — Encounter: Payer: Self-pay | Admitting: Cardiology

## 2021-10-24 DIAGNOSIS — I5041 Acute combined systolic (congestive) and diastolic (congestive) heart failure: Secondary | ICD-10-CM

## 2021-10-25 NOTE — Telephone Encounter (Signed)
Yes lets check a BMET and BNP

## 2021-10-26 ENCOUNTER — Other Ambulatory Visit (HOSPITAL_COMMUNITY)
Admission: RE | Admit: 2021-10-26 | Discharge: 2021-10-26 | Disposition: A | Discharge status: Home / Self Care | Payer: Medicare Other | Source: Ambulatory Visit | Attending: Cardiology | Admitting: Cardiology

## 2021-10-26 ENCOUNTER — Other Ambulatory Visit: Payer: Self-pay

## 2021-10-26 ENCOUNTER — Ambulatory Visit: Payer: Medicare Other | Admitting: Student

## 2021-10-26 DIAGNOSIS — I5041 Acute combined systolic (congestive) and diastolic (congestive) heart failure: Secondary | ICD-10-CM | POA: Insufficient documentation

## 2021-10-26 LAB — BASIC METABOLIC PANEL
Anion gap: 9 (ref 5–15)
BUN: 18 mg/dL (ref 8–23)
CO2: 27 mmol/L (ref 22–32)
Calcium: 9.3 mg/dL (ref 8.9–10.3)
Chloride: 101 mmol/L (ref 98–111)
Creatinine, Ser: 1.55 mg/dL — ABNORMAL HIGH (ref 0.44–1.00)
GFR, Estimated: 34 mL/min — ABNORMAL LOW (ref >60)
Glucose, Bld: 93 mg/dL (ref 70–99)
Potassium: 3.7 mmol/L (ref 3.5–5.1)
Sodium: 137 mmol/L (ref 135–145)

## 2021-10-26 LAB — BRAIN NATRIURETIC PEPTIDE: B Natriuretic Peptide: 1289 pg/mL — ABNORMAL HIGH (ref 0.0–100.0)

## 2021-10-26 LAB — CBC
HCT: 34.7 % — ABNORMAL LOW (ref 36.0–46.0)
Hemoglobin: 10.6 g/dL — ABNORMAL LOW (ref 12.0–15.0)
MCH: 29.4 pg (ref 26.0–34.0)
MCHC: 30.5 g/dL (ref 30.0–36.0)
MCV: 96.1 fL (ref 80.0–100.0)
Platelets: 222 10*3/uL (ref 150–400)
RBC: 3.61 MIL/uL — ABNORMAL LOW (ref 3.87–5.11)
RDW: 15.9 % — ABNORMAL HIGH (ref 11.5–15.5)
WBC: 4.9 10*3/uL (ref 4.0–10.5)
nRBC: 0 % (ref 0.0–0.2)

## 2021-10-26 NOTE — Progress Notes (Signed)
Cardiology Office Note:    Date:  10/27/2021   ID:  Caitlin, Powell 27-Feb-1946, MRN [id number]  PCP:  Celene Squibb, MD  Cardiologist:  None  Electrophysiologist:  None   Referring MD: Celene Squibb, MD   No chief complaint on file.   History of Present Illness:    [name] is a 76 y.o. female with a hx of hypertension, nonobstructive CAD, hyperlipidemia who presents for follow-up.  She was initially seen on 09/08/2021.  She reports she has been having dyspnea for last several months.  States that she is feels short of breath with minimal exertion.  Reports had episode of what she thinks was gastritis in July 2022, which he describes as pain in her upper abdomen and chest, but no chest pain since that time.  She denies any lightheadedness, syncope, or palpitations.  Has noted swelling in her left leg.  She smoked for over 40 years, quit in 2009.  Echocardiogram 06/27/2012 showed EF 65 to 70%, normal RV function, no significant valvular disease.  Carotid duplex 08/2015 showed 1 to 39% bilateral internal carotid artery stenosis.  Echocardiogram on 09/20/2021 showed EF 20 to 25%, mild LV dilatation, moderate RV dysfunction, RVSP 61 mmHg, severe left atrial enlargement, small pericardial effusion, severe MR, mild AI.  Labs on 09/12/2021 showed creatinine 1.77 (increased from 1.55 in August), BNP 2321, hemoglobin 10.3.  Given her severe systolic heart failure and need for diuresis and tenuous kidney function, recommended admission.  She declined.  Cardiac catheterization was done as an outpatient on 10/09/2021, showed 50% proximal LAD 50% percent OM1, 60% proximal to distal LCx, 40% proximal RCA, 30% mid RCA, 50% RPDA.  RHC showed RA 4, RV 48/2, PA 48/19/33, PCWP 14, LVEDP 14, CI 3.2.  Since last clinic visit, she reports that she is doing okay.  Continues to have intermittent dyspnea.  Denies any chest pain,  lightheadedness, syncope, lower extremity edema.   Wt Readings from Last 3  Encounters:  10/27/21 115 lb 9.6 oz (52.4 kg)  10/09/21 123 lb (55.8 kg)  09/28/21 125 lb 6.4 oz (56.9 kg)     Past Medical History:  Diagnosis Date   Cataract of right eye    Essential hypertension, benign    History of cardiac catheterization    Reportedly no significant CAD 1998 - previously followed with Dr. Velora Heckler   Mixed hyperlipidemia    Osteoporosis    Recurrent UTI     Past Surgical History:  Procedure Laterality Date   ABDOMINAL HYSTERECTOMY     CATARACT EXTRACTION W/PHACO  09/17/2011   Procedure: CATARACT EXTRACTION PHACO AND INTRAOCULAR LENS PLACEMENT (IOC);  Surgeon: Tonny Branch;  Location: [facility];  Service: Ophthalmology;  Laterality: Right;  CDE:23.52   CYSTOSCOPY     FOOT SURGERY     LIPOMA EXCISION     Back   RIGHT/LEFT HEART CATH AND CORONARY ANGIOGRAPHY N/A 10/09/2021   Procedure: RIGHT/LEFT HEART CATH AND CORONARY ANGIOGRAPHY;  Surgeon: Burnell Blanks, MD;  Location: [facility];  Service: Cardiovascular;  Laterality: N/A;    Current Medications: Current Meds  Medication Sig   aspirin EC 81 MG tablet Take 1 tablet (81 mg total) by mouth daily. Swallow whole.   losartan (COZAAR) 25 MG tablet Take 1 tablet (25 mg total) by mouth daily.   metoprolol succinate (TOPROL XL) 25 MG 24 hr tablet Take 1 tablet (25 mg total) by mouth daily.   trimethoprim (TRIMPEX) 100 MG tablet  Take 50 mg by mouth every other day.   [DISCONTINUED] ibuprofen (ADVIL) 200 MG tablet Take 200 mg by mouth every 6 (six) hours as needed for moderate pain.     Allergies:   Eggs or egg-derived products, Lac bovis, Milk-related compounds, and Sulfa antibiotics   Social History   Socioeconomic History   Marital status: Divorced    Spouse name: Not on file   Number of children: Not on file   Years of education: Not on file   Highest education level: Not on file  Occupational History   Not on file  Tobacco Use   Smoking status: Former    Types: Cigarettes    Smokeless tobacco: Never  Vaping Use   Vaping Use: Never used  Substance and Sexual Activity   Alcohol use: No   Drug use: No   Sexual activity: Not on file  Other Topics Concern   Not on file  Social History Narrative   Not on file   Social Determinants of Health   Financial Resource Strain: Not on file  Food Insecurity: Not on file  Transportation Needs: Not on file  Physical Activity: Not on file  Stress: Not on file  Social Connections: Not on file     Family History: The patient's family history includes Cancer in her father and mother; Diabetes in her brother; Hypertension in her brother and mother; Stroke in her brother and mother.  ROS:   Please see the history of present illness.     All other systems reviewed and are negative.  EKGs/Labs/Other Studies Reviewed:    The following studies were reviewed today:   EKG:  EKG is  not ordered today.  The ekg ordered at prior clinic visit demonstrates sinnus tachycardia, rate 103, LAD, nonspecific T wave flattening  Recent Labs: 09/12/2021: ALT 7; TSH 1.873 10/26/2021: B Natriuretic Peptide 1,289.0; BUN 18; Creatinine, Ser 1.55; Hemoglobin 10.6; Platelets 222; Potassium 3.7; Sodium 137  Recent Lipid Panel    Component Value Date/Time   CHOL 173 09/12/2021 1128   TRIG 78 09/12/2021 1128   HDL 58 09/12/2021 1128   CHOLHDL 3.0 09/12/2021 1128   VLDL 16 09/12/2021 1128   LDLCALC 99 09/12/2021 1128    Physical Exam:    VS:  BP 126/76    Pulse 86    Ht 5\' 4"  (1.626 m)    Wt 115 lb 9.6 oz (52.4 kg)    SpO2 99%    BMI 19.84 kg/m     Wt Readings from Last 3 Encounters:  10/27/21 115 lb 9.6 oz (52.4 kg)  10/09/21 123 lb (55.8 kg)  09/28/21 125 lb 6.4 oz (56.9 kg)     GEN:  in no acute distress HEENT: Normal NECK: No JVD CARDIAC: RRR, no murmurs, rubs, gallops RESPIRATORY:  Clear to auscultation without rales, wheezing or rhonchi  ABDOMEN: Soft, non-tender, non-distended MUSCULOSKELETAL:  No edema NEUROLOGIC:   Alert and oriented x 3 PSYCHIATRIC:  Normal affect   ASSESSMENT:    1. Chronic combined systolic and diastolic heart failure ([place])   2. Nonischemic cardiomyopathy ([place])   3. Hyperlipidemia, unspecified hyperlipidemia type   4. Stage 3b chronic kidney disease ([place])   5. Severe mitral regurgitation      PLAN:    Chronic combined systolic and diastolic heart failure: Echocardiogram on 09/20/2021 showed EF 20 to 25%, mild LV dilatation, moderate RV dysfunction, RVSP 61 mmHg, severe left atrial enlargement, small pericardial effusion, severe MR, mild AI.  Cardiac catheterization  on 10/09/2021, showed 50% proximal LAD 50% percent OM1, 60% proximal to distal LCx, 40% proximal RCA, 30% mid RCA, 50% RPDA.  RHC showed RA 4, RV 48/2, PA 48/19/33, PCWP 14, LVEDP 14, CI 3.2. -Appears euvolemic.  Continue Lasix as need if gains more than 3 pounds in 1 day or 5 pounds in 1 week -Start losartan 25 mg daily.  Check BMET in 1 week -Start Toprol-XL 25 mg daily in 1 week -Will plan to add Jardiance and spironolactone as tolerated -Cardiac MRI to work-up nonischemic cardiomyopathy  Mitral regurgitation: Severe on echo 09/20/2021.  Appears functional.  Will quantify severity on MRI  Nonobstructive CAD: Cardiac catheterization on 10/09/2021, showed 50% proximal LAD 50% percent OM1, 60% proximal to distal LCx, 40% proximal RCA, 30% mid RCA, 50% RPDA -Start aspirin 81 mg daily -Intolerance to statin.  Referred to pharmacy lipid clinic for PCSK9 inhibitor  CKD stage IIIb: Creatinine 1.55 on 10/26/2021  Hyperlipidemia: LDL 99 on 09/12/21, has been as high as 180.  Unable to tolerate statins.  Refer to pharmacy for PCSK9 inhibitor  Carotid stenosis: Carotid duplex 08/2015 showed 1 to 39% bilateral internal carotid artery stenosis.   RTC in 2 months    Medication Adjustments/Labs and Tests Ordered: Current medicines are reviewed at length with the patient today.  Concerns regarding medicines are outlined  above.  Orders Placed This Encounter  Procedures   MR CARDIAC MORPHOLOGY W WO CONTRAST   Basic metabolic panel   AMB Referral to [place] ordered this encounter  Medications   losartan (COZAAR) 25 MG tablet    Sig: Take 1 tablet (25 mg total) by mouth daily.    Dispense:  90 tablet    Refill:  3   metoprolol succinate (TOPROL XL) 25 MG 24 hr tablet    Sig: Take 1 tablet (25 mg total) by mouth daily.    Dispense:  90 tablet    Refill:  3    Start on 11/03/2021   aspirin EC 81 MG tablet    Sig: Take 1 tablet (81 mg total) by mouth daily. Swallow whole.    Dispense:  90 tablet    Refill:  3     Patient Instructions  Medication Instructions:  START Losartan 25 mg tablets daily IN ONE WEEK- IF TOLERATING LOSARTAN- START TOPROL-XL 25 mg tablets daily START Aspirin 81 mg tablets daily STOP TAKING Ibuprofen, take TYLENOL as needed for pain  *If you need a refill on your cardiac medications before your next appointment, please call your pharmacy*   Lab Work: IN ONE WEEK: BMET If you have labs (blood work) drawn today and your tests are completely normal, you will receive your results only by: [place] (if you have MyChart) OR A paper copy in the mail If you have any lab test that is abnormal or we need to change your treatment, we will call you to review the results.   Testing/Procedures: Cardiac MRI    Follow-Up: At [facility], you and your health needs are our priority.  As part of our continuing mission to provide you with exceptional heart care, we have created designated Provider Care Teams.  These Care Teams include your primary Cardiologist (physician) and Advanced Practice Providers (APPs -  Physician Assistants and Nurse Practitioners) who all work together to provide you with the care you need, when you need it.  We recommend signing up for the patient portal called "MyChart".  Sign up  information is provided on this After  Visit Summary.  MyChart is used to connect with patients for Virtual Visits (Telemedicine).  Patients are able to view lab/test results, encounter notes, upcoming appointments, etc.  Non-urgent messages can be sent to your provider as well.   To learn more about what you can do with MyChart, go to [email or web address].    Your next appointment:   Keep scheduled follow up appointment with Ed Blalock, PA-C   Other Instructions You have been referred to the [facility]. They will call you with your first appointment      You are scheduled for Cardiac MRI on ______________. Please arrive at the [facility] main entrance of [facility] at ________________ (30-45 minutes prior to test start time). ?  [facility] [address] [place], [place] [address] [phone]  Please take advantage of the free valet parking available at the MAIN entrance (A entrance). Proceed to the [facility] Radiology Department (First Floor). ? Magnetic resonance imaging (MRI) is a painless test that produces images of the inside of the body without using Xrays.  During an MRI, strong magnets and radio waves work together in a Research officer, political party to form detailed images.   MRI images may provide more details about a medical condition than X-rays, CT scans, and ultrasounds can provide.  You may be given earphones to listen for instructions.  You may eat a light breakfast and take medications as ordered with the exception of HCTZ (fluid pill, other). Please avoid stimulants for 12 hr prior to test. (Ie. Caffeine, nicotine, chocolate, or antihistamine medications)  If a contrast material will be used, an IV will be inserted into one of your veins. Contrast material will be injected into your IV. It will leave your body through your urine within a day. You may be told to drink plenty of fluids to help flush the contrast material out of your system.  You will be asked to remove all metal,  including: Watch, jewelry, and other metal objects including hearing aids, hair pieces and dentures. Also wearable glucose monitoring systems (ie. Freestyle Libre and Omnipods) (Braces and fillings normally are not a problem.)   TEST WILL TAKE APPROXIMATELY 1 HOUR  PLEASE NOTIFY SCHEDULING AT LEAST 24 HOURS IN ADVANCE IF YOU ARE UNABLE TO KEEP YOUR APPOINTMENT. [phone]  Please call Marchia Bond, cardiac imaging nurse navigator with any questions/concerns. Marchia Bond RN Navigator Cardiac Imaging Gordy Clement RN Navigator Cardiac Imaging [facility] [phone] Office       Signed, Donato Heinz, MD  10/27/2021 9:39 PM    [place]

## 2021-10-27 ENCOUNTER — Ambulatory Visit: Payer: Medicare Other | Admitting: Cardiology

## 2021-10-27 ENCOUNTER — Encounter: Payer: Self-pay | Admitting: Cardiology

## 2021-10-27 VITALS — BP 126/76 | HR 86 | Ht 64.0 in | Wt 115.6 lb

## 2021-10-27 DIAGNOSIS — E785 Hyperlipidemia, unspecified: Secondary | ICD-10-CM

## 2021-10-27 DIAGNOSIS — I5042 Chronic combined systolic (congestive) and diastolic (congestive) heart failure: Secondary | ICD-10-CM

## 2021-10-27 DIAGNOSIS — I428 Other cardiomyopathies: Secondary | ICD-10-CM | POA: Diagnosis not present

## 2021-10-27 DIAGNOSIS — I340 Nonrheumatic mitral (valve) insufficiency: Secondary | ICD-10-CM

## 2021-10-27 DIAGNOSIS — N1832 Chronic kidney disease, stage 3b: Secondary | ICD-10-CM

## 2021-10-27 MED ORDER — METOPROLOL SUCCINATE ER 25 MG PO TB24: 25.0000 mg | ORAL_TABLET | Freq: Every day | ORAL | 3 refills | Status: DC

## 2021-10-27 MED ORDER — LOSARTAN POTASSIUM 25 MG PO TABS: 25.0000 mg | ORAL_TABLET | Freq: Every day | ORAL | 3 refills | Status: DC

## 2021-10-27 MED ORDER — ASPIRIN EC 81 MG PO TBEC: 81.0000 mg | DELAYED_RELEASE_TABLET | Freq: Every day | ORAL | 3 refills | Status: DC

## 2021-10-27 NOTE — Patient Instructions (Addendum)
Medication Instructions:  START Losartan 25 mg tablets daily IN ONE WEEK- IF TOLERATING LOSARTAN- START TOPROL-XL 25 mg tablets daily START Aspirin 81 mg tablets daily STOP TAKING Ibuprofen, take TYLENOL as needed for pain  *If you need a refill on your cardiac medications before your next appointment, please call your pharmacy*   Lab Work: IN ONE WEEK: BMET If you have labs (blood work) drawn today and your tests are completely normal, you will receive your results only by: [place] (if you have MyChart) OR A paper copy in the mail If you have any lab test that is abnormal or we need to change your treatment, we will call you to review the results.   Testing/Procedures: Cardiac MRI    Follow-Up: At [facility], you and your health needs are our priority.  As part of our continuing mission to provide you with exceptional heart care, we have created designated Provider Care Teams.  These Care Teams include your primary Cardiologist (physician) and Advanced Practice Providers (APPs -  Physician Assistants and Nurse Practitioners) who all work together to provide you with the care you need, when you need it.  We recommend signing up for the patient portal called "MyChart".  Sign up information is provided on this After Visit Summary.  MyChart is used to connect with patients for Virtual Visits (Telemedicine).  Patients are able to view lab/test results, encounter notes, upcoming appointments, etc.  Non-urgent messages can be sent to your provider as well.   To learn more about what you can do with MyChart, go to [email or web address].    Your next appointment:   Keep scheduled follow up appointment with Ed Blalock, PA-C   Other Instructions You have been referred to the [facility]. They will call you with your first appointment      You are scheduled for Cardiac MRI on ______________. Please arrive at the [facility] main entrance of [facility] at  ________________ (30-45 minutes prior to test start time). ?  [facility] [address] [place], [place] [address] [phone]  Please take advantage of the free valet parking available at the MAIN entrance (A entrance). Proceed to the [facility] Radiology Department (First Floor). ? Magnetic resonance imaging (MRI) is a painless test that produces images of the inside of the body without using Xrays.  During an MRI, strong magnets and radio waves work together in a Research officer, political party to form detailed images.   MRI images may provide more details about a medical condition than X-rays, CT scans, and ultrasounds can provide.  You may be given earphones to listen for instructions.  You may eat a light breakfast and take medications as ordered with the exception of HCTZ (fluid pill, other). Please avoid stimulants for 12 hr prior to test. (Ie. Caffeine, nicotine, chocolate, or antihistamine medications)  If a contrast material will be used, an IV will be inserted into one of your veins. Contrast material will be injected into your IV. It will leave your body through your urine within a day. You may be told to drink plenty of fluids to help flush the contrast material out of your system.  You will be asked to remove all metal, including: Watch, jewelry, and other metal objects including hearing aids, hair pieces and dentures. Also wearable glucose monitoring systems (ie. Freestyle Libre and Omnipods) (Braces and fillings normally are not a problem.)   TEST WILL TAKE APPROXIMATELY 1 HOUR  PLEASE NOTIFY SCHEDULING AT LEAST 24  HOURS IN ADVANCE IF YOU ARE UNABLE TO KEEP YOUR APPOINTMENT. [phone]  Please call Marchia Bond, cardiac imaging nurse navigator with any questions/concerns. Marchia Bond RN Navigator Cardiac Imaging Gordy Clement RN Navigator Cardiac Imaging [facility] V[facility] Services [phone] Office

## 2021-11-03 ENCOUNTER — Other Ambulatory Visit (HOSPITAL_COMMUNITY)
Admission: RE | Admit: 2021-11-03 | Discharge: 2021-11-03 | Disposition: A | Discharge status: Home / Self Care | Payer: Medicare Other | Source: Ambulatory Visit | Attending: Cardiology | Admitting: Cardiology

## 2021-11-03 DIAGNOSIS — E785 Hyperlipidemia, unspecified: Secondary | ICD-10-CM | POA: Diagnosis not present

## 2021-11-03 LAB — BASIC METABOLIC PANEL
Anion gap: 7 (ref 5–15)
BUN: 22 mg/dL (ref 8–23)
CO2: 28 mmol/L (ref 22–32)
Calcium: 9.2 mg/dL (ref 8.9–10.3)
Chloride: 100 mmol/L (ref 98–111)
Creatinine, Ser: 1.74 mg/dL — ABNORMAL HIGH (ref 0.44–1.00)
GFR, Estimated: 30 mL/min — ABNORMAL LOW (ref >60)
Glucose, Bld: 94 mg/dL (ref 70–99)
Potassium: 4.1 mmol/L (ref 3.5–5.1)
Sodium: 135 mmol/L (ref 135–145)

## 2021-11-07 ENCOUNTER — Telehealth: Payer: Self-pay | Admitting: Cardiology

## 2021-11-07 NOTE — Telephone Encounter (Signed)
Patient calling to see if the forms that she left a checkout for her handicapp sticker has been filled out. Please advise

## 2021-11-07 NOTE — Telephone Encounter (Signed)
Informed pt that paperwork was ready for pick up at the front of the office in [place].

## 2021-11-30 ENCOUNTER — Ambulatory Visit: Payer: Medicare Other | Admitting: Student

## 2021-12-28 DIAGNOSIS — D509 Iron deficiency anemia, unspecified: Secondary | ICD-10-CM | POA: Diagnosis not present

## 2021-12-28 DIAGNOSIS — E559 Vitamin D deficiency, unspecified: Secondary | ICD-10-CM | POA: Diagnosis not present

## 2021-12-28 DIAGNOSIS — Z0000 Encounter for general adult medical examination without abnormal findings: Secondary | ICD-10-CM | POA: Diagnosis not present

## 2021-12-28 DIAGNOSIS — N184 Chronic kidney disease, stage 4 (severe): Secondary | ICD-10-CM | POA: Diagnosis not present

## 2021-12-28 DIAGNOSIS — I10 Essential (primary) hypertension: Secondary | ICD-10-CM | POA: Diagnosis not present

## 2021-12-28 DIAGNOSIS — J449 Chronic obstructive pulmonary disease, unspecified: Secondary | ICD-10-CM | POA: Diagnosis not present

## 2022-01-01 ENCOUNTER — Other Ambulatory Visit: Payer: Self-pay

## 2022-01-01 ENCOUNTER — Encounter: Payer: Self-pay | Admitting: Medical

## 2022-01-01 ENCOUNTER — Ambulatory Visit: Payer: Medicare Other | Admitting: Medical

## 2022-01-01 VITALS — BP 118/68 | HR 98 | Ht 64.0 in | Wt 126.4 lb

## 2022-01-01 DIAGNOSIS — I2510 Atherosclerotic heart disease of native coronary artery without angina pectoris: Secondary | ICD-10-CM

## 2022-01-01 DIAGNOSIS — I428 Other cardiomyopathies: Secondary | ICD-10-CM | POA: Diagnosis not present

## 2022-01-01 DIAGNOSIS — R0989 Other specified symptoms and signs involving the circulatory and respiratory systems: Secondary | ICD-10-CM

## 2022-01-01 DIAGNOSIS — N1832 Chronic kidney disease, stage 3b: Secondary | ICD-10-CM

## 2022-01-01 DIAGNOSIS — I5042 Chronic combined systolic (congestive) and diastolic (congestive) heart failure: Secondary | ICD-10-CM

## 2022-01-01 DIAGNOSIS — I340 Nonrheumatic mitral (valve) insufficiency: Secondary | ICD-10-CM

## 2022-01-01 DIAGNOSIS — E785 Hyperlipidemia, unspecified: Secondary | ICD-10-CM

## 2022-01-01 MED ORDER — DAPAGLIFLOZIN PROPANEDIOL 10 MG PO TABS: 10.0000 mg | ORAL_TABLET | Freq: Every day | ORAL | 6 refills | Status: DC

## 2022-01-01 NOTE — Progress Notes (Signed)
Cardiology Office Note:    Date:  01/01/2022   ID:  [name], [name] 12/14/1945, MRN [id number]  PCP:  Celene Squibb, MD  [facility] Cardiologist:  None  [facility] Electrophysiologist:  None   Referring MD: Celene Squibb, MD   Chief Complaint: 2 month follow-up  History of Present Illness:    [name] is a 76 y.o. female with a hx of HTN, nonobstructive CAD, chronic combined heart failure, HLD who presents for follow-up.   Echo 2013 showed EF 65-70%, normal RV function, no significant valve disease. Carotid duplex 08/2015 showed 1-39% bilateral internal carotid artery stenosis.   Seen 08/2021 for SOB. Echo 09/20/21 showed EF 20-25%, mild LV dilatation, moderate RV dysfunction, RVSP 61 mmHg, severe left atrial enlargement, small pericardial effusion, severe MR, mild AI.  Labs on 09/12/2021 showed creatinine 1.77 (increased from 1.55 in August), BNP 2321, hemoglobin 10.3.  Given her severe systolic heart failure and need for diuresis and tenuous kidney function, recommended admission.  She declined.  Cardiac catheterization was done as an outpatient on 10/09/2021, showed 50% proximal LAD 50% percent OM1, 60% proximal to distal LCx, 40% proximal RCA, 30% mid RCA, 50% RPDA.  RHC showed RA 4, RV 48/2, PA 48/19/33, PCWP 14, LVEDP 14, CI 3.2.  Last seen 10/27/21 and was overall doing well. Cardiac MRI was ordered. She was started on Losartan and toprol.   Today, the patient reports she is not strong enough to drive to [facility] for cardiac MRI work-up. She is slowly getting her strength back. She is walking from the kitchen to the garage and back. Says she might have some claustrophobia and is a little apprehensive about MRI.  She tried taking Toprol and this caused severe weakness. She also reports a lot of hair loss, PCP ordered blood work for this. She is on Lasix as needed.   Past Medical History:  Diagnosis Date   Cataract of right eye    Essential hypertension, benign     History of cardiac catheterization    Reportedly no significant CAD 1998 - previously followed with Dr. Velora Heckler   Mixed hyperlipidemia    Osteoporosis    Recurrent UTI     Past Surgical History:  Procedure Laterality Date   ABDOMINAL HYSTERECTOMY     CATARACT EXTRACTION W/PHACO  09/17/2011   Procedure: CATARACT EXTRACTION PHACO AND INTRAOCULAR LENS PLACEMENT ([place]);  Surgeon: Tonny Branch;  Location: [facility];  Service: Ophthalmology;  Laterality: Right;  CDE:23.52   CYSTOSCOPY     FOOT SURGERY     LIPOMA EXCISION     Back   RIGHT/LEFT HEART CATH AND CORONARY ANGIOGRAPHY N/A 10/09/2021   Procedure: RIGHT/LEFT HEART CATH AND CORONARY ANGIOGRAPHY;  Surgeon: Burnell Blanks, MD;  Location: [place] [facility];  Service: Cardiovascular;  Laterality: N/A;    Current Medications: Current Meds  Medication Sig   aspirin EC 81 MG tablet Take 1 tablet (81 mg total) by mouth daily. Swallow whole.   dapagliflozin propanediol (FARXIGA) 10 MG TABS tablet Take 1 tablet (10 mg total) by mouth daily before breakfast.   furosemide (LASIX) 40 MG tablet Take 1 tablet (40 mg total) by mouth daily.   losartan (COZAAR) 25 MG tablet Take 1 tablet (25 mg total) by mouth daily.   metoprolol succinate (TOPROL XL) 25 MG 24 hr tablet Take 1 tablet (25 mg total) by mouth daily.   ondansetron (ZOFRAN) 4 MG tablet Take 4 mg by mouth every 6 (six)  hours as needed.   trimethoprim (TRIMPEX) 100 MG tablet Take 50 mg by mouth every other day.     Allergies:   Eggs or egg-derived products, Lac bovis, Milk-related compounds, and Sulfa antibiotics   Social History   Socioeconomic History   Marital status: Divorced    Spouse name: Not on file   Number of children: Not on file   Years of education: Not on file   Highest education level: Not on file  Occupational History   Not on file  Tobacco Use   Smoking status: Former    Types: Cigarettes   Smokeless tobacco: Never  Vaping Use   Vaping Use: Never used   Substance and Sexual Activity   Alcohol use: No   Drug use: No   Sexual activity: Not on file  Other Topics Concern   Not on file  Social History Narrative   Not on file   Social Determinants of Health   Financial Resource Strain: Not on file  Food Insecurity: Not on file  Transportation Needs: Not on file  Physical Activity: Not on file  Stress: Not on file  Social Connections: Not on file     Family History: The patient's family history includes Cancer in her father and mother; Diabetes in her brother; Hypertension in her brother and mother; Stroke in her brother and mother.  ROS:   Please see the history of present illness.     All other systems reviewed and are negative.  EKGs/Labs/Other Studies Reviewed:    The following studies were reviewed today:  R/L Cardiac cath 10/09/21   Prox LAD lesion is 50% stenosed.   Mid LAD lesion is 50% stenosed.   1st Mrg lesion is 100% stenosed.   Prox Cx to Dist Cx lesion is 60% stenosed.   Prox RCA lesion is 40% stenosed.   Mid RCA lesion is 30% stenosed.   RPDA lesion is 50% stenosed.   LV end diastolic pressure is normal.   Hemodynamic findings consistent with mild pulmonary hypertension.   The LAD is a large caliber vessel that courses to the apex. There is moderate non-obstructive disease in the proximal and mid LAD. This does not appear to be flow limiting The Circumflex is a moderate caliber vessel with chronic occlusion of the small first obtuse marginal branch and diffuse moderate disease in the mid and distal AV groove segment of the Circumflex. No focal targets for PCI The RCA is a large dominant vessel with high anterior takeoff. There is mild to moderate proximal and mid vessel stenosis. Moderate stenosis in the small caliber PDA LVEDP 14 mmHg PCWP  mean 14 PA 48/19 (mean 33) CO 5.12 L/min (CI 3.21)   Recommendations: I suspect that her cardiomyopathy is not ischemic. There is diffuse moderate non-obstructive CAD.  Her right and left heart pressures are not significantly elevated. Would continue with current medical therapy including diuretics. Add in GDMT as tolerated for her cardiomyopathy and systolic CHF.   Coronary Diagrams  Diagnostic Dominance: Right    Echo 08/2021   1. Left ventricular ejection fraction, by estimation, is 20 to 25%. The  left ventricle has severely decreased function. The left ventricle  demonstrates regional wall motion abnormalities (see scoring  diagram/findings for description). The left  ventricular internal cavity size was mildly dilated. Left ventricular  diastolic parameters are indeterminate. Elevated left ventricular  end-diastolic pressure. Prominent apical trabeculation and calcification  without obvious thrombus.   2. Right ventricular systolic function is moderately reduced.  The right  ventricular size is normal. There is severely elevated pulmonary artery  systolic pressure. The estimated right ventricular systolic pressure is  [id number] mmHg.   3. Left atrial size was severely dilated.   4. Right atrial size was mildly dilated.   5. A small pericardial effusion is present. The pericardial effusion is  posterior to the left ventricle.   6. The mitral valve is abnormal. Severe mitral valve regurgitation.   7. Tricuspid valve regurgitation is moderate.   8. The aortic valve is tricuspid. Aortic valve regurgitation is mild.  Aortic valve mean gradient measures 2.0 mmHg.   9. The inferior vena cava is normal in size with <50% respiratory  variability, suggesting right atrial pressure of 8 mmHg.   Comparison(s): Prior images unable to be directly viewed.  EKG:  EKG is not ordered today.    Recent Labs: 09/12/2021: ALT 7; TSH 1.873 10/26/2021: B Natriuretic Peptide 1,289.0; Hemoglobin 10.6; Platelets 222 11/03/2021: BUN 22; Creatinine, Ser 1.74; Potassium 4.1; Sodium 135  Recent Lipid Panel    Component Value Date/Time   CHOL 173 09/12/2021 1128   TRIG 78  09/12/2021 1128   HDL 58 09/12/2021 1128   CHOLHDL 3.0 09/12/2021 1128   VLDL 16 09/12/2021 1128   LDLCALC 99 09/12/2021 1128    Physical Exam:    VS:  BP 118/68    Pulse 98    Ht 5\' 4"  (1.626 m)    Wt 126 lb 6.4 oz (57.3 kg)    SpO2 97%    BMI 21.70 kg/m     Wt Readings from Last 3 Encounters:  01/01/22 126 lb 6.4 oz (57.3 kg)  10/27/21 115 lb 9.6 oz (52.4 kg)  10/09/21 123 lb (55.8 kg)     GEN:  Well nourished, well developed in no acute distress HEENT: Normal NECK: No JVD; b/l carotid bruits LYMPHATICS: No lymphadenopathy CARDIAC: RRR, no murmurs, rubs, gallops RESPIRATORY:  Clear to auscultation without rales, wheezing or rhonchi  ABDOMEN: Soft, non-tender, non-distended MUSCULOSKELETAL:  No edema; No deformity  SKIN: Warm and dry NEUROLOGIC:  Alert and oriented x 3 PSYCHIATRIC:  Normal affect   ASSESSMENT:    1. Chronic combined systolic and diastolic heart failure ([place])   2. Atherosclerosis of native coronary artery of native heart, unspecified whether angina present   3. Bilateral carotid bruits   4. Nonischemic cardiomyopathy (HCC)   5. Stage 3b chronic kidney disease ([place])   6. Severe mitral regurgitation   7. Hyperlipidemia, unspecified hyperlipidemia type    PLAN:    In order of problems listed above:  Chronic combined systolic and diastolic heart failure Patient has been unable to complete cardiac MRI due to inability to drive to [facility] from longstanding weakness. Says fatigue/weakness is slowly improving. She didn't tolerate BB due to severe fatigue. She is taking Losartan. Caution with GDMT given CKD. She is euvolemic on exam today. She takes lasix only as needed. I will add on Farxiga 10mg  daily, BMET in 1-2 weeks.Continue Losartan. She may tolerate BB in the future. She will schedule cardiac MRI when able.   MR Most recent echo 08/2021 showed severe MR. No acute symptoms. Can quantify severity on MRI.   Nonobstructive CAD Cardiac catheterization  on 10/09/2021, showed 50% proximal LAD 50% percent OM1, 60% proximal to distal LCx, 40% proximal RCA, 30% mid RCA, 50% RPDA. Continue Aspirin. She did not tolerate BB due to weakness. She has intolerance to statins, she will pursue PCSK9i as able.  CKD stage 3 Most recent Scr 1.74. Follow up labs with initiation of Farxiga.   HLD LDL 99 on 08/2021. Referred to PCSK9i but patient is unable to drive to [place]. When she is strong enough she will start injections.   Carotid stenosis Bruits on exam. I will re-check US carotids  Disposition: Follow up in 2-3 month(s) with Md/APP    Signed, Fronnie Urton Ninfa Meeker, PA-C  01/01/2022 3:08 PM    [place]

## 2022-01-01 NOTE — Patient Instructions (Addendum)
Medication Instructions:  ?Start Farxiga 10 mg tablets daily ? ?Labwork: ?1-2 weeks:  ?BMET ? ?Testing/Procedures: ?Your physician has requested that you have a carotid duplex. This test is an ultrasound of the carotid arteries in your neck. It looks at blood flow through these arteries that supply the brain with blood. Allow one hour for this exam. There are no restrictions or special instructions. ? ? ?Follow-Up: ?Follow up with Cadence Furth, PA-C in 2-3 months. ? ?Any Other Special Instructions Will Be Listed Below (If Applicable). ? ? ? ? ?If you need a refill on your cardiac medications before your next appointment, please call your pharmacy. ? ?

## 2022-01-09 ENCOUNTER — Ambulatory Visit (HOSPITAL_COMMUNITY): Payer: Medicare Other

## 2022-01-11 ENCOUNTER — Encounter: Payer: Self-pay | Admitting: Cardiology

## 2022-01-11 NOTE — Telephone Encounter (Signed)
For heart failure the dose of [name] is 10 mg, would recommend continuing ?

## 2022-01-17 ENCOUNTER — Ambulatory Visit (HOSPITAL_COMMUNITY): Payer: Medicare Other

## 2022-01-25 ENCOUNTER — Ambulatory Visit (HOSPITAL_COMMUNITY)
Admission: RE | Admit: 2022-01-25 | Discharge: 2022-01-25 | Disposition: A | Discharge status: Home / Self Care | Payer: Medicare Other | Source: Ambulatory Visit | Attending: Medical | Admitting: Medical

## 2022-01-25 DIAGNOSIS — R0989 Other specified symptoms and signs involving the circulatory and respiratory systems: Secondary | ICD-10-CM | POA: Insufficient documentation

## 2022-01-25 DIAGNOSIS — I6523 Occlusion and stenosis of bilateral carotid arteries: Secondary | ICD-10-CM | POA: Diagnosis not present

## 2022-02-16 ENCOUNTER — Telehealth: Payer: Self-pay

## 2022-02-16 NOTE — Telephone Encounter (Signed)
-----   Message from Jarvis Newcomer, RN sent at 02/14/2022  1:41 PM EDT ----- ? ?----- Message ----- ?From: Fransico Michael, Cadence H, PA-C ?Sent: 02/14/2022  12:12 PM EDT ?To: Jarvis Newcomer, RN ? ?US carotids showed non-stenotic carotid disease, plan to medically manage. She is on Aspirin, will discuss cholesterol med at follow-up. ? ?

## 2022-02-16 NOTE — Telephone Encounter (Signed)
Patient notified and verbalized understanding. Pt had no questions or concerns at this time. PCP copied.  ?

## 2022-03-26 ENCOUNTER — Ambulatory Visit: Payer: Medicare Other | Admitting: Medical

## 2022-04-04 ENCOUNTER — Ambulatory Visit: Payer: Medicare Other | Admitting: Student

## 2022-04-09 DIAGNOSIS — N289 Disorder of kidney and ureter, unspecified: Secondary | ICD-10-CM | POA: Diagnosis not present

## 2022-04-09 DIAGNOSIS — N261 Atrophy of kidney (terminal): Secondary | ICD-10-CM | POA: Diagnosis not present

## 2022-04-09 DIAGNOSIS — N390 Urinary tract infection, site not specified: Secondary | ICD-10-CM | POA: Diagnosis not present

## 2022-04-09 DIAGNOSIS — N281 Cyst of kidney, acquired: Secondary | ICD-10-CM | POA: Diagnosis not present

## 2022-04-09 DIAGNOSIS — Z09 Encounter for follow-up examination after completed treatment for conditions other than malignant neoplasm: Secondary | ICD-10-CM | POA: Diagnosis not present

## 2022-04-09 NOTE — Progress Notes (Unsigned)
Cardiology Office Note:    Date:  04/09/2022   ID:  Caitlin, Powell 02-08-1946, MRN [id number]  PCP:  Benita Stabile, MD  Cardiologist:  None  Electrophysiologist:  None   Referring MD: Benita Stabile, MD   No chief complaint on file.   History of Present Illness:    [name] is a 76 y.o. female with a hx of hypertension, nonobstructive CAD, hyperlipidemia who presents for follow-up.  She was initially seen on 09/08/2021.  She reports she has been having dyspnea for last several months.  States that she is feels short of breath with minimal exertion.  Reports had episode of what she thinks was gastritis in July 2022, which he describes as pain in her upper abdomen and chest, but no chest pain since that time.  She denies any lightheadedness, syncope, or palpitations.  Has noted swelling in her left leg.  She smoked for over 40 years, quit in 2009.  Echocardiogram 06/27/2012 showed EF 65 to 70%, normal RV function, no significant valvular disease.  Carotid duplex 08/2015 showed 1 to 39% bilateral internal carotid artery stenosis.  Echocardiogram on 09/20/2021 showed EF 20 to 25%, mild LV dilatation, moderate RV dysfunction, RVSP 61 mmHg, severe left atrial enlargement, small pericardial effusion, severe MR, mild AI.  Labs on 09/12/2021 showed creatinine 1.77 (increased from 1.55 in August), BNP 2321, hemoglobin 10.3.  Given her severe systolic heart failure and need for diuresis and tenuous kidney function, recommended admission.  She declined.  Cardiac catheterization was done as an outpatient on 10/09/2021, showed 50% proximal LAD 50% percent OM1, 60% proximal to distal LCx, 40% proximal RCA, 30% mid RCA, 50% RPDA.  RHC showed RA 4, RV 48/2, PA 48/19/33, PCWP 14, LVEDP 14, CI 3.2.  Since last clinic visit,  she reports that she is doing okay.  Continues to have intermittent dyspnea.  Denies any chest pain,  lightheadedness, syncope, lower extremity edema.   Wt Readings from Last 3  Encounters:  01/01/22 126 lb 6.4 oz (57.3 kg)  10/27/21 115 lb 9.6 oz (52.4 kg)  10/09/21 123 lb (55.8 kg)     Past Medical History:  Diagnosis Date   Cataract of right eye    Essential hypertension, benign    History of cardiac catheterization    Reportedly no significant CAD 1998 - previously followed with Dr. Corinda Gubler   Mixed hyperlipidemia    Osteoporosis    Recurrent UTI     Past Surgical History:  Procedure Laterality Date   ABDOMINAL HYSTERECTOMY     CATARACT EXTRACTION W/PHACO  09/17/2011   Procedure: CATARACT EXTRACTION PHACO AND INTRAOCULAR LENS PLACEMENT ([facility]);  Surgeon: Gemma Payor;  Location: [facility];  Service: Ophthalmology;  Laterality: Right;  CDE:23.52   CYSTOSCOPY     FOOT SURGERY     LIPOMA EXCISION     Back   RIGHT/LEFT HEART CATH AND CORONARY ANGIOGRAPHY N/A 10/09/2021   Procedure: RIGHT/LEFT HEART CATH AND CORONARY ANGIOGRAPHY;  Surgeon: Kathleene Hazel, MD;  Location: [facility];  Service: Cardiovascular;  Laterality: N/A;    Current Medications: No outpatient medications have been marked as taking for the 04/13/22 encounter (Appointment) with Little Ishikawa, MD.     Allergies:   Eggs or egg-derived products, Lac bovis, Milk-related compounds, and Sulfa antibiotics   Social History   Socioeconomic History   Marital status: Divorced    Spouse name: Not on file   Number of children: Not on file  Years of education: Not on file   Highest education level: Not on file  Occupational History   Not on file  Tobacco Use   Smoking status: Former    Types: Cigarettes   Smokeless tobacco: Never  Vaping Use   Vaping Use: Never used  Substance and Sexual Activity   Alcohol use: No   Drug use: No   Sexual activity: Not on file  Other Topics Concern   Not on file  Social History Narrative   Not on file   Social Determinants of Health   Financial Resource Strain: Not on file  Food Insecurity: Not on file  Transportation  Needs: Not on file  Physical Activity: Not on file  Stress: Not on file  Social Connections: Not on file     Family History: The patient's family history includes Cancer in her father and mother; Diabetes in her brother; Hypertension in her brother and mother; Stroke in her brother and mother.  ROS:   Please see the history of present illness.     All other systems reviewed and are negative.  EKGs/Labs/Other Studies Reviewed:    The following studies were reviewed today:   EKG:  EKG is  not ordered today.  The ekg ordered at prior clinic visit demonstrates sinnus tachycardia, rate 103, LAD, nonspecific T wave flattening  Recent Labs: 09/12/2021: ALT 7; TSH 1.873 10/26/2021: B Natriuretic Peptide 1,289.0; Hemoglobin 10.6; Platelets 222 11/03/2021: BUN 22; Creatinine, Ser 1.74; Potassium 4.1; Sodium 135  Recent Lipid Panel    Component Value Date/Time   CHOL 173 09/12/2021 1128   TRIG 78 09/12/2021 1128   HDL 58 09/12/2021 1128   CHOLHDL 3.0 09/12/2021 1128   VLDL 16 09/12/2021 1128   LDLCALC 99 09/12/2021 1128    Physical Exam:    VS:  There were no vitals taken for this visit.    Wt Readings from Last 3 Encounters:  01/01/22 126 lb 6.4 oz (57.3 kg)  10/27/21 115 lb 9.6 oz (52.4 kg)  10/09/21 123 lb (55.8 kg)     GEN:  in no acute distress HEENT: Normal NECK: No JVD CARDIAC: RRR, no murmurs, rubs, gallops RESPIRATORY:  Clear to auscultation without rales, wheezing or rhonchi  ABDOMEN: Soft, non-tender, non-distended MUSCULOSKELETAL:  No edema NEUROLOGIC:  Alert and oriented x 3 PSYCHIATRIC:  Normal affect   ASSESSMENT:    No diagnosis found.    PLAN:    Chronic combined systolic and diastolic heart failure: Echocardiogram on 09/20/2021 showed EF 20 to 25%, mild LV dilatation, moderate RV dysfunction, RVSP 61 mmHg, severe left atrial enlargement, small pericardial effusion, severe MR, mild AI.  Cardiac catheterization on 10/09/2021, showed 50% proximal LAD  50% percent OM1, 60% proximal to distal LCx, 40% proximal RCA, 30% mid RCA, 50% RPDA.  RHC showed RA 4, RV 48/2, PA 48/19/33, PCWP 14, LVEDP 14, CI 3.2. -Appears euvolemic.  Continue Lasix as need if gains more than 3 pounds in 1 day or 5 pounds in 1 week -Continue losartan 25 mg daily.  Check BMET in 1 week -Continue Toprol-XL 25 mg daily -Continue Farxiga 10 mg daily -Check BMET/magnesium.  If stable will add spironolactone 12.5 mg daily -Cardiac MRI to work-up nonischemic cardiomyopathy  Mitral regurgitation: Severe on echo 09/20/2021.  Appears functional.  Will quantify severity on MRI  Nonobstructive CAD: Cardiac catheterization on 10/09/2021, showed 50% proximal LAD 50% percent OM1, 60% proximal to distal LCx, 40% proximal RCA, 30% mid RCA, 50% RPDA -Start aspirin 81  mg daily -Intolerance to statin.  Referred to pharmacy lipid clinic for PCSK9 inhibitor  CKD stage IIIb: Creatinine 1.55 on 10/26/2021  Hyperlipidemia: LDL 99 on 09/12/21, has been as high as 180.  Unable to tolerate statins.  Refer to pharmacy for PCSK9 inhibitor  Carotid stenosis: Carotid duplex 08/2015 showed 1 to 39% bilateral internal carotid artery stenosis.   RTC in ***    Medication Adjustments/Labs and Tests Ordered: Current medicines are reviewed at length with the patient today.  Concerns regarding medicines are outlined above.  No orders of the defined types were placed in this encounter.   No orders of the defined types were placed in this encounter.    There are no Patient Instructions on file for this visit.   Signed, Little Ishikawa, MD  04/09/2022 2:44 PM    [facility]

## 2022-04-13 ENCOUNTER — Other Ambulatory Visit (HOSPITAL_COMMUNITY)
Admission: RE | Admit: 2022-04-13 | Discharge: 2022-04-13 | Disposition: A | Discharge status: Home / Self Care | Payer: Medicare Other | Source: Ambulatory Visit | Attending: Cardiology | Admitting: Cardiology

## 2022-04-13 ENCOUNTER — Ambulatory Visit: Payer: Medicare Other | Admitting: Cardiology

## 2022-04-13 ENCOUNTER — Encounter: Payer: Self-pay | Admitting: Cardiology

## 2022-04-13 VITALS — BP 112/80 | HR 89 | Ht 64.0 in | Wt 129.6 lb

## 2022-04-13 DIAGNOSIS — I5042 Chronic combined systolic (congestive) and diastolic (congestive) heart failure: Secondary | ICD-10-CM | POA: Diagnosis not present

## 2022-04-13 DIAGNOSIS — I10 Essential (primary) hypertension: Secondary | ICD-10-CM

## 2022-04-13 DIAGNOSIS — I5043 Acute on chronic combined systolic (congestive) and diastolic (congestive) heart failure: Secondary | ICD-10-CM | POA: Diagnosis not present

## 2022-04-13 DIAGNOSIS — I2510 Atherosclerotic heart disease of native coronary artery without angina pectoris: Secondary | ICD-10-CM

## 2022-04-13 DIAGNOSIS — N1832 Chronic kidney disease, stage 3b: Secondary | ICD-10-CM

## 2022-04-13 DIAGNOSIS — I340 Nonrheumatic mitral (valve) insufficiency: Secondary | ICD-10-CM | POA: Diagnosis not present

## 2022-04-13 LAB — COMPREHENSIVE METABOLIC PANEL
ALT: 10 U/L (ref 0–44)
AST: 22 U/L (ref 15–41)
Albumin: 3.5 g/dL (ref 3.5–5.0)
Alkaline Phosphatase: 97 U/L (ref 38–126)
Anion gap: 7 (ref 5–15)
BUN: 25 mg/dL — ABNORMAL HIGH (ref 8–23)
CO2: 32 mmol/L (ref 22–32)
Calcium: 9.2 mg/dL (ref 8.9–10.3)
Chloride: 99 mmol/L (ref 98–111)
Creatinine, Ser: 1.93 mg/dL — ABNORMAL HIGH (ref 0.44–1.00)
GFR, Estimated: 26 mL/min — ABNORMAL LOW (ref >60)
Glucose, Bld: 104 mg/dL — ABNORMAL HIGH (ref 70–99)
Potassium: 3.7 mmol/L (ref 3.5–5.1)
Sodium: 138 mmol/L (ref 135–145)
Total Bilirubin: 1.2 mg/dL (ref 0.3–1.2)
Total Protein: 7 g/dL (ref 6.5–8.1)

## 2022-04-13 LAB — MAGNESIUM: Magnesium: 2 mg/dL (ref 1.7–2.4)

## 2022-04-13 LAB — BRAIN NATRIURETIC PEPTIDE: B Natriuretic Peptide: 3564 pg/mL — ABNORMAL HIGH (ref 0.0–100.0)

## 2022-04-13 MED ORDER — FUROSEMIDE 40 MG PO TABS: 40.0000 mg | ORAL_TABLET | Freq: Every day | ORAL | 3 refills | Status: DC

## 2022-04-16 ENCOUNTER — Other Ambulatory Visit: Payer: Self-pay | Admitting: *Deleted

## 2022-04-16 DIAGNOSIS — Z79899 Other long term (current) drug therapy: Secondary | ICD-10-CM

## 2022-04-16 DIAGNOSIS — I5043 Acute on chronic combined systolic (congestive) and diastolic (congestive) heart failure: Secondary | ICD-10-CM

## 2022-04-16 MED ORDER — POTASSIUM CHLORIDE CRYS ER 20 MEQ PO TBCR: 20.0000 meq | EXTENDED_RELEASE_TABLET | Freq: Every day | ORAL | 3 refills | Status: DC

## 2022-04-19 ENCOUNTER — Encounter: Payer: Self-pay | Admitting: Cardiology

## 2022-04-19 MED ORDER — POTASSIUM CHLORIDE 40 MEQ/15ML (20%) PO SOLN: 7.5000 mL | Freq: Every day | ORAL | 1 refills | Status: DC

## 2022-04-22 NOTE — Progress Notes (Signed)
Cardiology Office Note:    Date:  04/25/2022   ID:  Caitlin, Powell 1946-02-10, MRN [id number]  PCP:  Benita Stabile, MD  Cardiologist:  None  Electrophysiologist:  None   Referring MD: Benita Stabile, MD   Chief Complaint  Patient presents with   Congestive Heart Failure    History of Present Illness:    [name] is a 76 y.o. female with a hx of hypertension, nonobstructive CAD, hyperlipidemia who presents for follow-up.  She was initially seen on 09/08/2021.  She reports she has been having dyspnea for last several months.  States that she is feels short of breath with minimal exertion.  Reports had episode of what she thinks was gastritis in July 2022, which he describes as pain in her upper abdomen and chest, but no chest pain since that time.  She denies any lightheadedness, syncope, or palpitations.  Has noted swelling in her left leg.  She smoked for over 40 years, quit in 2009.  Echocardiogram 06/27/2012 showed EF 65 to 70%, normal RV function, no significant valvular disease.  Carotid duplex 08/2015 showed 1 to 39% bilateral internal carotid artery stenosis.  Echocardiogram on 09/20/2021 showed EF 20 to 25%, mild LV dilatation, moderate RV dysfunction, RVSP 61 mmHg, severe left atrial enlargement, small pericardial effusion, severe MR, mild AI.  Labs on 09/12/2021 showed creatinine 1.77 (increased from 1.55 in August), BNP 2321, hemoglobin 10.3.  Given her severe systolic heart failure and need for diuresis and tenuous kidney function, recommended admission.  She declined.  Cardiac catheterization was done as an outpatient on 10/09/2021, showed 50% proximal LAD 50% percent OM1, 60% proximal to distal LCx, 40% proximal RCA, 30% mid RCA, 50% RPDA.  RHC showed RA 4, RV 48/2, PA 48/19/33, PCWP 14, LVEDP 14, CI 3.2.  Since last clinic visit, she reports that she is doing better.  Has lost 8 pounds since last clinic visit.  Reports lower extremity edema has significantly improved.  She  took Lasix 40 mg for 3 to 4 days then went back to 20 mg daily.   Wt Readings from Last 3 Encounters:  04/25/22 121 lb 3.2 oz (55 kg)  04/13/22 129 lb 9.6 oz (58.8 kg)  01/01/22 126 lb 6.4 oz (57.3 kg)     Past Medical History:  Diagnosis Date   Cataract of right eye    Essential hypertension, benign    History of cardiac catheterization    Reportedly no significant CAD 1998 - previously followed with Dr. Corinda Gubler   Mixed hyperlipidemia    Osteoporosis    Recurrent UTI     Past Surgical History:  Procedure Laterality Date   ABDOMINAL HYSTERECTOMY     CATARACT EXTRACTION W/PHACO  09/17/2011   Procedure: CATARACT EXTRACTION PHACO AND INTRAOCULAR LENS PLACEMENT (IOC);  Surgeon: Gemma Payor;  Location: [facility];  Service: Ophthalmology;  Laterality: Right;  CDE:23.52   CYSTOSCOPY     FOOT SURGERY     LIPOMA EXCISION     Back   RIGHT/LEFT HEART CATH AND CORONARY ANGIOGRAPHY N/A 10/09/2021   Procedure: RIGHT/LEFT HEART CATH AND CORONARY ANGIOGRAPHY;  Surgeon: Kathleene Hazel, MD;  Location: [facility];  Service: Cardiovascular;  Laterality: N/A;    Current Medications: Current Meds  Medication Sig   aspirin EC 81 MG tablet Take 1 tablet (81 mg total) by mouth daily. Swallow whole.   dapagliflozin propanediol (FARXIGA) 10 MG TABS tablet Take 1 tablet (10 mg total) by  mouth daily before breakfast.   furosemide (LASIX) 20 MG tablet Take 1 tablet (20 mg total) by mouth daily. Take an extra 20 mg (1 tablet) for leg swelling, if weight is up 3 pounds in one day or 5 pounds in 1 week.   losartan (COZAAR) 25 MG tablet Take 1 tablet (25 mg total) by mouth daily.   ondansetron (ZOFRAN) 4 MG tablet Take 4 mg by mouth every 6 (six) hours as needed.   Potassium Chloride 40 MEQ/15ML (20%) SOLN Take 7.5 mLs by mouth daily.   trimethoprim (TRIMPEX) 100 MG tablet Take 50 mg by mouth every other day.   [DISCONTINUED] furosemide (LASIX) 40 MG tablet Take 1 tablet (40 mg total) by mouth  daily.   [DISCONTINUED] metoprolol succinate (TOPROL XL) 25 MG 24 hr tablet Take 1 tablet (25 mg total) by mouth daily.     Allergies:   Eggs or egg-derived products, Lac bovis, Milk-related compounds, and Sulfa antibiotics   Social History   Socioeconomic History   Marital status: Divorced    Spouse name: Not on file   Number of children: Not on file   Years of education: Not on file   Highest education level: Not on file  Occupational History   Not on file  Tobacco Use   Smoking status: Former    Types: Cigarettes   Smokeless tobacco: Never  Vaping Use   Vaping Use: Never used  Substance and Sexual Activity   Alcohol use: No   Drug use: No   Sexual activity: Not on file  Other Topics Concern   Not on file  Social History Narrative   Not on file   Social Determinants of Health   Financial Resource Strain: Not on file  Food Insecurity: Not on file  Transportation Needs: Not on file  Physical Activity: Not on file  Stress: Not on file  Social Connections: Not on file     Family History: The patient's family history includes Cancer in her father and mother; Diabetes in her brother; Hypertension in her brother and mother; Stroke in her brother and mother.  ROS:   Please see the history of present illness.     All other systems reviewed and are negative.  EKGs/Labs/Other Studies Reviewed:    The following studies were reviewed today:   EKG:   04/25/2022: Sinus rhythm, first-degree AV block, rate 86, poor R wave progression, left axis deviation  Recent Labs: 09/12/2021: TSH 1.873 10/26/2021: Hemoglobin 10.6; Platelets 222 04/13/2022: ALT 10; B Natriuretic Peptide 3,564.0; BUN 25; Creatinine, Ser 1.93; Magnesium 2.0; Potassium 3.7; Sodium 138  Recent Lipid Panel    Component Value Date/Time   CHOL 173 09/12/2021 1128   TRIG 78 09/12/2021 1128   HDL 58 09/12/2021 1128   CHOLHDL 3.0 09/12/2021 1128   VLDL 16 09/12/2021 1128   LDLCALC 99 09/12/2021 1128     Physical Exam:    VS:  BP 104/66   Pulse 86   Ht 5\' 4"  (1.626 m)   Wt 121 lb 3.2 oz (55 kg)   SpO2 98%   BMI 20.80 kg/m     Wt Readings from Last 3 Encounters:  04/25/22 121 lb 3.2 oz (55 kg)  04/13/22 129 lb 9.6 oz (58.8 kg)  01/01/22 126 lb 6.4 oz (57.3 kg)     GEN:  in no acute distress HEENT: Normal NECK: No JVD CARDIAC: RRR, no murmurs, rubs, gallops RESPIRATORY: CTAB  ABDOMEN: Soft, non-tender, non-distended MUSCULOSKELETAL:  Trace edema NEUROLOGIC:  Alert and oriented x 3 PSYCHIATRIC:  Normal affect   ASSESSMENT:    1. Chronic combined systolic and diastolic heart failure ([place])   2. Severe mitral regurgitation   3. Stage 3b chronic kidney disease ([place])   4. Hyperlipidemia, unspecified hyperlipidemia type   5. Medication management      PLAN:    Chronic combined systolic and diastolic heart failure: Echocardiogram on 09/20/2021 showed EF 20 to 25%, mild LV dilatation, moderate RV dysfunction, RVSP 61 mmHg, severe left atrial enlargement, small pericardial effusion, severe MR, mild AI.  Cardiac catheterization on 10/09/2021, showed 50% proximal LAD 50% percent OM1, 60% proximal to distal LCx, 40% proximal RCA, 30% mid RCA, 50% RPDA.  RHC showed RA 4, RV 48/2, PA 48/19/33, PCWP 14, LVEDP 14, CI 3.2. -Volume status improved from prior clinic visit, continue Lasix 20 mg daily but would take extra 20 mg if having lower extremity edema or weight gain of more than 3 pounds in 1 day or 5 pounds in 1 week.  Check BMET -Continue losartan 25 mg daily.   -Unable to tolerate low-dose Toprol-XL due to fatigue. -Reported intolerance to [place], but reports felt like it was worsening her edema.  [name] has diuretic effect and should help with edema.  Recommend trialing Farxiga 10 mg daily again -Recommend cardiac MRI to work-up nonischemic cardiomyopathy but she declines at this time. -Repeat echocardiogram prior to next clinic visit  Mitral regurgitation: Severe on echo  09/20/2021.  Appears functional.  Will follow with treatment of her heart failure  Nonobstructive CAD: Cardiac catheterization on 10/09/2021, showed 50% proximal LAD 50% percent OM1, 60% proximal to distal LCx, 40% proximal RCA, 30% mid RCA, 50% RPDA -Continue aspirin 81 mg daily -Intolerance to statin.  Referred to pharmacy lipid clinic for PCSK9 inhibitor  CKD stage IIIb: Creatinine 1.93 on 04/13/2022.  Check BMET  Hyperlipidemia: LDL 99 on 09/12/21, has been as high as 180.  Unable to tolerate statins.  Referred to pharmacy for PCSK9 inhibitor  Carotid stenosis: Carotid duplex 08/2015 showed 1 to 39% bilateral internal carotid artery stenosis.  Carotid duplex 01/25/2022 showed discordant results: Peak velocities suggesting no significant stenosis, with ICA/CCA ratio suggesting 50 to 69% stenosis.  Will monitor   RTC in 3 months    Medication Adjustments/Labs and Tests Ordered: Current medicines are reviewed at length with the patient today.  Concerns regarding medicines are outlined above.  Orders Placed This Encounter  Procedures   Basic Metabolic Panel (BMET)   Pro b natriuretic peptide (BNP)   Magnesium   EKG 12-Lead   ECHOCARDIOGRAM COMPLETE    Meds ordered this encounter  Medications   furosemide (LASIX) 20 MG tablet    Sig: Take 1 tablet (20 mg total) by mouth daily. Take an extra 20 mg (1 tablet) for leg swelling, if weight is up 3 pounds in one day or 5 pounds in 1 week.    Dispense:  90 tablet    Refill:  3     Patient Instructions  Medication Instructions:  Your physician has recommended you make the following change in your medication:  DECREASE: Lasix 20 mg (1 tablet) once daily. Take an extra 20 mg (1 tablet) for leg swelling, if weight is up 3 pounds in one day or 5 pounds in 1 week.  *If you need a refill on your cardiac medications before your next appointment, please call your pharmacy*   Lab Work: Your physician recommends that you return for lab work  in:  TODAY: BMET, Mag, BNP If you have labs (blood work) drawn today and your tests are completely normal, you will receive your results only by: MyChart Message (if you have MyChart) OR A paper copy in the mail If you have any lab test that is abnormal or we need to change your treatment, we will call you to review the results.   Testing/Procedures: Your physician has requested that you have an echocardiogram in 3 month. Echocardiography is a painless test that uses sound waves to create images of your heart. It provides your doctor with information about the size and shape of your heart and how well your heart's chambers and valves are working. This procedure takes approximately one hour. There are no restrictions for this procedure.    Follow-Up: At [facility], you and your health needs are our priority.  As part of our continuing mission to provide you with exceptional heart care, we have created designated Provider Care Teams.  These Care Teams include your primary Cardiologist (physician) and Advanced Practice Providers (APPs -  Physician Assistants and Nurse Practitioners) who all work together to provide you with the care you need, when you need it.  We recommend signing up for the patient portal called "MyChart".  Sign up information is provided on this After Visit Summary.  MyChart is used to connect with patients for Virtual Visits (Telemedicine).  Patients are able to view lab/test results, encounter notes, upcoming appointments, etc.  Non-urgent messages can be sent to your provider as well.   To learn more about what you can do with MyChart, go to [email or web address].    Your next appointment:   Oct 12th at 1:40pm   The format for your next appointment:   In Person  Provider:   Epifanio Lesches, MD   Other Instructions   Important Information About Sugar         Signed, Little Ishikawa, MD  04/25/2022 4:42 PM     [facility]

## 2022-04-25 ENCOUNTER — Ambulatory Visit: Payer: Medicare Other | Admitting: Cardiology

## 2022-04-25 ENCOUNTER — Encounter: Payer: Self-pay | Admitting: Cardiology

## 2022-04-25 VITALS — BP 104/66 | HR 86 | Ht 64.0 in | Wt 121.2 lb

## 2022-04-25 DIAGNOSIS — Z79899 Other long term (current) drug therapy: Secondary | ICD-10-CM | POA: Diagnosis not present

## 2022-04-25 DIAGNOSIS — I5042 Chronic combined systolic (congestive) and diastolic (congestive) heart failure: Secondary | ICD-10-CM

## 2022-04-25 DIAGNOSIS — N1832 Chronic kidney disease, stage 3b: Secondary | ICD-10-CM

## 2022-04-25 DIAGNOSIS — I340 Nonrheumatic mitral (valve) insufficiency: Secondary | ICD-10-CM | POA: Diagnosis not present

## 2022-04-25 DIAGNOSIS — E785 Hyperlipidemia, unspecified: Secondary | ICD-10-CM

## 2022-04-25 MED ORDER — FUROSEMIDE 20 MG PO TABS: 20.0000 mg | ORAL_TABLET | Freq: Every day | ORAL | 3 refills | Status: DC

## 2022-04-25 NOTE — Patient Instructions (Addendum)
Medication Instructions:  Your physician has recommended you make the following change in your medication:  DECREASE: Lasix 20 mg (1 tablet) once daily. Take an extra 20 mg (1 tablet) for leg swelling, if weight is up 3 pounds in one day or 5 pounds in 1 week.  *If you need a refill on your cardiac medications before your next appointment, please call your pharmacy*   Lab Work: Your physician recommends that you return for lab work in:  TODAY: BMET, Mag, BNP If you have labs (blood work) drawn today and your tests are completely normal, you will receive your results only by: MyChart Message (if you have MyChart) OR A paper copy in the mail If you have any lab test that is abnormal or we need to change your treatment, we will call you to review the results.   Testing/Procedures: Your physician has requested that you have an echocardiogram in 3 month. Echocardiography is a painless test that uses sound waves to create images of your heart. It provides your doctor with information about the size and shape of your heart and how well your heart's chambers and valves are working. This procedure takes approximately one hour. There are no restrictions for this procedure.    Follow-Up: At [facility], you and your health needs are our priority.  As part of our continuing mission to provide you with exceptional heart care, we have created designated Provider Care Teams.  These Care Teams include your primary Cardiologist (physician) and Advanced Practice Providers (APPs -  Physician Assistants and Nurse Practitioners) who all work together to provide you with the care you need, when you need it.  We recommend signing up for the patient portal called "MyChart".  Sign up information is provided on this After Visit Summary.  MyChart is used to connect with patients for Virtual Visits (Telemedicine).  Patients are able to view lab/test results, encounter notes, upcoming appointments, etc.  Non-urgent  messages can be sent to your provider as well.   To learn more about what you can do with MyChart, go to [email or web address].    Your next appointment:   Oct 12th at 1:40pm   The format for your next appointment:   In Person  Provider:   Epifanio Lesches, MD   Other Instructions   Important Information About Sugar

## 2022-04-26 ENCOUNTER — Other Ambulatory Visit (HOSPITAL_BASED_OUTPATIENT_CLINIC_OR_DEPARTMENT_OTHER): Payer: Self-pay

## 2022-04-26 DIAGNOSIS — Z79899 Other long term (current) drug therapy: Secondary | ICD-10-CM

## 2022-04-26 LAB — BASIC METABOLIC PANEL
BUN/Creatinine Ratio: 16 (ref 12–28)
BUN: 31 mg/dL — ABNORMAL HIGH (ref 8–27)
CO2: 26 mmol/L (ref 20–29)
Calcium: 9.9 mg/dL (ref 8.7–10.3)
Chloride: 95 mmol/L — ABNORMAL LOW (ref 96–106)
Creatinine, Ser: 1.99 mg/dL — ABNORMAL HIGH (ref 0.57–1.00)
Glucose: 99 mg/dL (ref 70–99)
Potassium: 4.7 mmol/L (ref 3.5–5.2)
Sodium: 136 mmol/L (ref 134–144)
eGFR: 25 mL/min/{1.73_m2} — ABNORMAL LOW (ref >59)

## 2022-04-26 LAB — PRO B NATRIURETIC PEPTIDE: NT-Pro BNP: 48723 pg/mL — ABNORMAL HIGH (ref 0–738)

## 2022-04-26 LAB — MAGNESIUM: Magnesium: 2.3 mg/dL (ref 1.6–2.3)

## 2022-04-27 ENCOUNTER — Other Ambulatory Visit (HOSPITAL_BASED_OUTPATIENT_CLINIC_OR_DEPARTMENT_OTHER): Payer: Self-pay

## 2022-04-27 MED ORDER — DAPAGLIFLOZIN PROPANEDIOL 5 MG PO TABS: 5.0000 mg | ORAL_TABLET | Freq: Every day | ORAL | 0 refills | Status: DC

## 2022-04-30 DIAGNOSIS — I10 Essential (primary) hypertension: Secondary | ICD-10-CM | POA: Diagnosis not present

## 2022-04-30 DIAGNOSIS — J449 Chronic obstructive pulmonary disease, unspecified: Secondary | ICD-10-CM | POA: Diagnosis not present

## 2022-04-30 DIAGNOSIS — N184 Chronic kidney disease, stage 4 (severe): Secondary | ICD-10-CM | POA: Diagnosis not present

## 2022-04-30 DIAGNOSIS — D509 Iron deficiency anemia, unspecified: Secondary | ICD-10-CM | POA: Diagnosis not present

## 2022-07-16 ENCOUNTER — Encounter: Payer: Self-pay | Admitting: Cardiology

## 2022-07-16 DIAGNOSIS — E559 Vitamin D deficiency, unspecified: Secondary | ICD-10-CM | POA: Diagnosis not present

## 2022-07-16 DIAGNOSIS — E782 Mixed hyperlipidemia: Secondary | ICD-10-CM | POA: Diagnosis not present

## 2022-07-16 DIAGNOSIS — I10 Essential (primary) hypertension: Secondary | ICD-10-CM | POA: Diagnosis not present

## 2022-07-20 DIAGNOSIS — I10 Essential (primary) hypertension: Secondary | ICD-10-CM | POA: Diagnosis not present

## 2022-07-20 DIAGNOSIS — N184 Chronic kidney disease, stage 4 (severe): Secondary | ICD-10-CM | POA: Diagnosis not present

## 2022-07-20 DIAGNOSIS — J449 Chronic obstructive pulmonary disease, unspecified: Secondary | ICD-10-CM | POA: Diagnosis not present

## 2022-07-20 DIAGNOSIS — D509 Iron deficiency anemia, unspecified: Secondary | ICD-10-CM | POA: Diagnosis not present

## 2022-07-26 ENCOUNTER — Ambulatory Visit (HOSPITAL_COMMUNITY)
Admission: RE | Admit: 2022-07-26 | Discharge: 2022-07-26 | Disposition: A | Discharge status: Home / Self Care | Payer: Medicare Other | Source: Ambulatory Visit | Attending: Cardiology | Admitting: Cardiology

## 2022-07-26 DIAGNOSIS — I5042 Chronic combined systolic (congestive) and diastolic (congestive) heart failure: Secondary | ICD-10-CM | POA: Diagnosis not present

## 2022-07-26 LAB — ECHOCARDIOGRAM COMPLETE
AR max vel: 2.26 cm2
AV Area VTI: 2.21 cm2
AV Area mean vel: 2.06 cm2
AV Mean grad: 2 mm[Hg]
AV Peak grad: 4.5 mm[Hg]
Ao pk vel: 1.06 m/s
Area-P 1/2: 3.79 cm2
Calc EF: 38.7 %
MV M vel: 5.08 m/s
MV Peak grad: 103.2 mm[Hg]
MV VTI: 1.88 cm2
Radius: 0.7 cm
S' Lateral: 5.25 cm
Single Plane A2C EF: 39.4 %
Single Plane A4C EF: 33.5 %

## 2022-07-26 NOTE — Progress Notes (Signed)
*  PRELIMINARY RESULTS* Echocardiogram 2D Echocardiogram has been performed.  [name] 07/26/2022, 4:10 PM

## 2022-08-01 NOTE — Progress Notes (Signed)
Cardiology Office Note:    Date:  08/02/2022   ID:  Caitlin, Powell 1946-06-25, MRN [id number]  PCP:  Caitlin Squibb, MD  Cardiologist:  None  Electrophysiologist:  None   Referring MD: Caitlin Squibb, MD   Chief Complaint  Patient presents with   Congestive Heart Failure    History of Present Illness:    [name] is a 76 y.o. female with a hx of hypertension, nonobstructive CAD, hyperlipidemia who presents for follow-up.  She was initially seen on 09/08/2021.  She reports she has been having dyspnea for last several months.  States that she is feels short of breath with minimal exertion.  Reports had episode of what she thinks was gastritis in July 2022, which he describes as pain in her upper abdomen and chest, but no chest pain since that time.  She denies any lightheadedness, syncope, or palpitations.  Has noted swelling in her left leg.  She smoked for over 40 years, quit in 2009.  Echocardiogram 06/27/2012 showed EF 65 to 70%, normal RV function, no significant valvular disease.  Carotid duplex 08/2015 showed 1 to 39% bilateral internal carotid artery stenosis.  Echocardiogram on 09/20/2021 showed EF 20 to 25%, mild LV dilatation, moderate RV dysfunction, RVSP 61 mmHg, severe left atrial enlargement, small pericardial effusion, severe MR, mild AI.  Labs on 09/12/2021 showed creatinine 1.77 (increased from 1.55 in August), BNP 2321, hemoglobin 10.3.  Given her severe systolic heart failure and need for diuresis and tenuous kidney function, recommended admission.  She declined.  Cardiac catheterization was done as an outpatient on 10/09/2021, showed 50% proximal LAD 50% percent OM1, 60% proximal to distal LCx, 40% proximal RCA, 30% mid RCA, 50% RPDA.  RHC showed RA 4, RV 48/2, PA 48/19/33, PCWP 14, LVEDP 14, CI 3.2.  Echocardiogram 07/26/2022 (study read but report not transferring to epic) showed EF 25 to 30%, moderate to severe LV dilatation, normal RV function, severe left atrial  enlargement, small pericardial effusion, moderate mitral regurgitation.  Since last clinic visit, she reports she has been doing okay.  Weight down 10 pounds from last clinic visit.  Lower extremity edema resolved, she has been off Lasix.  Has been taking Farxiga 5 mg daily.     Wt Readings from Last 3 Encounters:  08/02/22 111 lb 9.6 oz (50.6 kg)  04/25/22 121 lb 3.2 oz (55 kg)  04/13/22 129 lb 9.6 oz (58.8 kg)     Past Medical History:  Diagnosis Date   Cataract of right eye    Essential hypertension, benign    History of cardiac catheterization    Reportedly no significant CAD 1998 - previously followed with Dr. Velora Heckler   Mixed hyperlipidemia    Osteoporosis    Recurrent UTI     Past Surgical History:  Procedure Laterality Date   ABDOMINAL HYSTERECTOMY     CATARACT EXTRACTION W/PHACO  09/17/2011   Procedure: CATARACT EXTRACTION PHACO AND INTRAOCULAR LENS PLACEMENT (IOC);  Surgeon: Tonny Branch;  Location: [facility];  Service: Ophthalmology;  Laterality: Right;  CDE:23.52   CYSTOSCOPY     FOOT SURGERY     LIPOMA EXCISION     Back   RIGHT/LEFT HEART CATH AND CORONARY ANGIOGRAPHY N/A 10/09/2021   Procedure: RIGHT/LEFT HEART CATH AND CORONARY ANGIOGRAPHY;  Surgeon: Burnell Blanks, MD;  Location: [facility];  Service: Cardiovascular;  Laterality: N/A;    Current Medications: Current Meds  Medication Sig   losartan (COZAAR) 25 MG  tablet Take 1 tablet (25 mg total) by mouth daily.   trimethoprim (TRIMPEX) 100 MG tablet Take 50 mg by mouth every other day.   [DISCONTINUED] dapagliflozin propanediol (FARXIGA) 5 MG TABS tablet Take 1 tablet (5 mg total) by mouth daily before breakfast.     Allergies:   Cephalexin, Eggs or egg-derived products, Milk (cow), Milk-related compounds, and Sulfa antibiotics   Social History   Socioeconomic History   Marital status: Divorced    Spouse name: Not on file   Number of children: Not on file   Years of education: Not on  file   Highest education level: Not on file  Occupational History   Not on file  Tobacco Use   Smoking status: Former    Types: Cigarettes    Passive exposure: Never   Smokeless tobacco: Never  Vaping Use   Vaping Use: Never used  Substance and Sexual Activity   Alcohol use: No   Drug use: No   Sexual activity: Not on file  Other Topics Concern   Not on file  Social History Narrative   Not on file   Social Determinants of Health   Financial Resource Strain: Not on file  Food Insecurity: Not on file  Transportation Needs: Not on file  Physical Activity: Not on file  Stress: Not on file  Social Connections: Not on file     Family History: The patient's family history includes Cancer in her father and mother; Diabetes in her brother; Hypertension in her brother and mother; Stroke in her brother and mother.  ROS:   Please see the history of present illness.     All other systems reviewed and are negative.  EKGs/Labs/Other Studies Reviewed:    The following studies were reviewed today:   EKG:   04/25/2022: Sinus rhythm, first-degree AV block, rate 86, poor R wave progression, left axis deviation  Recent Labs: 09/12/2021: TSH 1.873 10/26/2021: Hemoglobin 10.6; Platelets 222 04/13/2022: ALT 10; B Natriuretic Peptide 3,564.0 04/25/2022: BUN 31; Creatinine, Ser 1.99; Magnesium 2.3; NT-Pro BNP 48,723; Potassium 4.7; Sodium 136  Recent Lipid Panel    Component Value Date/Time   CHOL 173 09/12/2021 1128   TRIG 78 09/12/2021 1128   HDL 58 09/12/2021 1128   CHOLHDL 3.0 09/12/2021 1128   VLDL 16 09/12/2021 1128   LDLCALC 99 09/12/2021 1128    Physical Exam:    VS:  BP 124/74 (BP Location: Left Arm, Patient Position: Sitting, Cuff Size: Normal)   Pulse 98   Ht 5\' 4"  (1.626 m)   Wt 111 lb 9.6 oz (50.6 kg)   SpO2 96%   BMI 19.16 kg/m     Wt Readings from Last 3 Encounters:  08/02/22 111 lb 9.6 oz (50.6 kg)  04/25/22 121 lb 3.2 oz (55 kg)  04/13/22 129 lb 9.6 oz (58.8  kg)     GEN:  in no acute distress HEENT: Normal NECK: No JVD CARDIAC: RRR, no murmurs, rubs, gallops RESPIRATORY: CTAB  ABDOMEN: Soft, non-tender, non-distended MUSCULOSKELETAL:  Trace edema NEUROLOGIC:  Alert and oriented x 3 PSYCHIATRIC:  Normal affect   ASSESSMENT:    1. Chronic combined systolic and diastolic heart failure ([place])   2. Nonrheumatic mitral valve regurgitation   3. Atherosclerosis of native coronary artery of native heart, unspecified whether angina present   4. Stage 3b chronic kidney disease ([place])   5. Hyperlipidemia, unspecified hyperlipidemia type      PLAN:    Chronic combined systolic and diastolic heart failure:  Echocardiogram on 09/20/2021 showed EF 20 to 25%, mild LV dilatation, moderate RV dysfunction, RVSP 61 mmHg, severe left atrial enlargement, small pericardial effusion, severe MR, mild AI.  Cardiac catheterization on 10/09/2021, showed 50% proximal LAD 50% percent OM1, 60% proximal to distal LCx, 40% proximal RCA, 30% mid RCA, 50% RPDA.  RHC showed RA 4, RV 48/2, PA 48/19/33, PCWP 14, LVEDP 14, CI 3.2.  Echocardiogram 07/26/2022 (study read but report not transferring to epic) showed EF 25 to 30%, moderate to severe LV dilatation, normal RV function, severe left atrial enlargement, small pericardial effusion, moderate mitral regurgitation. -Continue losartan 25 mg daily.   -Unable to tolerate low-dose Toprol-XL due to fatigue. -Hold off on spironolactone given renal function -Reported intolerance to Farxiga 10 mg daily but tolerating 5 mg daily.  She does have a history of UTIs, discussed to let us know if having issues with UTIs on Farxiga -Has not required Lasix since starting Farxiga.  Can continue Lasix 20 mg daily as needed if gains more than 3 pounds in 1 day or 5 pounds in 1 week, or if worsening lower extremity edema -Recommend cardiac MRI to work-up nonischemic cardiomyopathy but she declines at this time -Given her EF remains less than 35% on  echo 07/26/2022, discussed that she meets criteria for ICD but she is not interested at this time  Mitral regurgitation: Severe on echo 09/20/2021.  Appears functional.  Repeat echo 07/26/2022 shows moderate mitral regurgitation  Nonobstructive CAD: Cardiac catheterization on 10/09/2021, showed 50% proximal LAD 50% percent OM1, 60% proximal to distal LCx, 40% proximal RCA, 30% mid RCA, 50% RPDA -Continue aspirin 81 mg daily -Intolerance to statin.  Referred to pharmacy lipid clinic for PCSK9 inhibitor, but patient declines  CKD stage IIIb: Creatinine 1.9 on 07/16/2022.  Check BMET  Hyperlipidemia: LDL 101 on 07/17/22, has been as high as 180.  Unable to tolerate statins.  Referred to pharmacy for PCSK9 inhibitor  Carotid stenosis: Carotid duplex 08/2015 showed 1 to 39% bilateral internal carotid artery stenosis.  Carotid duplex 01/25/2022 showed discordant results: Peak velocities suggesting no significant stenosis, with ICA/CCA ratio suggesting 50 to 69% stenosis.  Will monitor   RTC in 3 months    Medication Adjustments/Labs and Tests Ordered: Current medicines are reviewed at length with the patient today.  Concerns regarding medicines are outlined above.  No orders of the defined types were placed in this encounter.   Meds ordered this encounter  Medications   dapagliflozin propanediol (FARXIGA) 5 MG TABS tablet    Sig: Take 1 tablet (5 mg total) by mouth daily before breakfast.    Dispense:  90 tablet    Refill:  1     Patient Instructions  Medication Instructions:  Your physician recommends that you continue on your current medications as directed. Please refer to the Current Medication list given to you today.   Labwork: none  Testing/Procedures: none  Follow-Up:  Your physician recommends that you schedule a follow-up appointment in: 3 months  Any Other Special Instructions Will Be Listed Below (If Applicable).  If you need a refill on your cardiac medications  before your next appointment, please call your pharmacy.    Signed, Donato Heinz, MD  08/02/2022 6:09 PM    [place]

## 2022-08-02 ENCOUNTER — Encounter: Payer: Self-pay | Admitting: Cardiology

## 2022-08-02 ENCOUNTER — Ambulatory Visit: Payer: Medicare Other | Attending: Cardiology | Admitting: Cardiology

## 2022-08-02 VITALS — BP 124/74 | HR 98 | Ht 64.0 in | Wt 111.6 lb

## 2022-08-02 DIAGNOSIS — I5042 Chronic combined systolic (congestive) and diastolic (congestive) heart failure: Secondary | ICD-10-CM | POA: Diagnosis not present

## 2022-08-02 DIAGNOSIS — E785 Hyperlipidemia, unspecified: Secondary | ICD-10-CM

## 2022-08-02 DIAGNOSIS — I2510 Atherosclerotic heart disease of native coronary artery without angina pectoris: Secondary | ICD-10-CM

## 2022-08-02 DIAGNOSIS — I340 Nonrheumatic mitral (valve) insufficiency: Secondary | ICD-10-CM

## 2022-08-02 DIAGNOSIS — N1832 Chronic kidney disease, stage 3b: Secondary | ICD-10-CM | POA: Diagnosis not present

## 2022-08-02 MED ORDER — DAPAGLIFLOZIN PROPANEDIOL 5 MG PO TABS: 5.0000 mg | ORAL_TABLET | Freq: Every day | ORAL | 1 refills | Status: DC

## 2022-08-02 NOTE — Patient Instructions (Addendum)
Medication Instructions:   Your physician recommends that you continue on your current medications as directed. Please refer to the Current Medication list given to you today.  Labwork:  none  Testing/Procedures:  none  Follow-Up:  Your physician recommends that you schedule a follow-up appointment in: 3 months.  Any Other Special Instructions Will Be Listed Below (If Applicable).  If you need a refill on your cardiac medications before your next appointment, please call your pharmacy. 

## 2022-10-12 ENCOUNTER — Other Ambulatory Visit: Payer: Self-pay

## 2022-10-12 MED ORDER — DAPAGLIFLOZIN PROPANEDIOL 5 MG PO TABS: 5.0000 mg | ORAL_TABLET | Freq: Every day | ORAL | 1 refills | Status: DC

## 2022-11-12 ENCOUNTER — Ambulatory Visit: Payer: Medicare Other | Admitting: Cardiology

## 2022-11-22 ENCOUNTER — Other Ambulatory Visit: Payer: Self-pay | Admitting: Adult Health

## 2023-01-16 ENCOUNTER — Ambulatory Visit: Payer: Medicare Other | Admitting: Cardiology

## 2023-01-31 NOTE — Progress Notes (Signed)
Cardiology Office Note:    Date:  02/01/2023   ID:  [name], [name] 1946/04/22, MRN [id number]  PCP:  Benita Stabile, MD  Cardiologist:  None  Electrophysiologist:  None   Referring MD: Benita Stabile, MD   Chief Complaint  Patient presents with   Congestive Heart Failure    History of Present Illness:    [name] is a 77 y.o. female with a hx of hypertension, nonobstructive CAD, hyperlipidemia who presents for follow-up.  She was initially seen on 09/08/2021.  She reports she has been having dyspnea for last several months.  States that she is feels short of breath with minimal exertion.  Reports had episode of what she thinks was gastritis in July 2022, which he describes as pain in her upper abdomen and chest, but no chest pain since that time.  She denies any lightheadedness, syncope, or palpitations.  Has noted swelling in her left leg.  She smoked for over 40 years, quit in 2009.  Echocardiogram 06/27/2012 showed EF 65 to 70%, normal RV function, no significant valvular disease.  Carotid duplex 08/2015 showed 1 to 39% bilateral internal carotid artery stenosis.  Echocardiogram on 09/20/2021 showed EF 20 to 25%, mild LV dilatation, moderate RV dysfunction, RVSP 61 mmHg, severe left atrial enlargement, small pericardial effusion, severe MR, mild AI.  Labs on 09/12/2021 showed creatinine 1.77 (increased from 1.55 in August), BNP 2321, hemoglobin 10.3.  Given her severe systolic heart failure and need for diuresis and tenuous kidney function, recommended admission.  She declined.  Cardiac catheterization was done as an outpatient on 10/09/2021, showed 50% proximal LAD 50% percent OM1, 60% proximal to distal LCx, 40% proximal RCA, 30% mid RCA, 50% RPDA.  RHC showed RA 4, RV 48/2, PA 48/19/33, PCWP 14, LVEDP 14, CI 3.2.  Echocardiogram 07/26/2022 (study read but report not transferring to epic) showed EF 25 to 30%, moderate to severe LV dilatation, normal RV function, severe left atrial  enlargement, small pericardial effusion, moderate mitral regurgitation.  Since last clinic visit, she reports she had significant issues recently with diarrhea.  Basically for all of March was having issues with diarrhea but now resolved.  Developed some lower extremity edema recently, has been taking Lasix 20 mg daily along with her potassium supplement every other day.  Denies any chest pain, lightheadedness, syncope, or palpitations.     Wt Readings from Last 3 Encounters:  02/01/23 118 lb 9.6 oz (53.8 kg)  08/02/22 111 lb 9.6 oz (50.6 kg)  04/25/22 121 lb 3.2 oz (55 kg)     Past Medical History:  Diagnosis Date   Cataract of right eye    Essential hypertension, benign    History of cardiac catheterization    Reportedly no significant CAD 1998 - previously followed with Dr. Corinda Gubler   Mixed hyperlipidemia    Osteoporosis    Recurrent UTI     Past Surgical History:  Procedure Laterality Date   ABDOMINAL HYSTERECTOMY     CATARACT EXTRACTION W/PHACO  09/17/2011   Procedure: CATARACT EXTRACTION PHACO AND INTRAOCULAR LENS PLACEMENT (IOC);  Surgeon: Gemma Payor;  Location: [facility];  Service: Ophthalmology;  Laterality: Right;  CDE:23.52   CYSTOSCOPY     FOOT SURGERY     LIPOMA EXCISION     Back   RIGHT/LEFT HEART CATH AND CORONARY ANGIOGRAPHY N/A 10/09/2021   Procedure: RIGHT/LEFT HEART CATH AND CORONARY ANGIOGRAPHY;  Surgeon: Kathleene Hazel, MD;  Location: [facility];  Service:  Cardiovascular;  Laterality: N/A;    Current Medications: Current Meds  Medication Sig   aspirin EC 81 MG tablet Take 1 tablet (81 mg total) by mouth every other day. Swallow whole.   dapagliflozin propanediol (FARXIGA) 5 MG TABS tablet Take 1 tablet (5 mg total) by mouth daily before breakfast.   furosemide (LASIX) 20 MG tablet Take 1 tablet (20 mg total) by mouth daily. Take an extra 20 mg (1 tablet) for leg swelling, if weight is up 3 pounds in one day or 5 pounds in 1 week.   losartan  (COZAAR) 25 MG tablet TAKE 1 TABLET BY MOUTH DAILY.   Potassium Chloride 40 MEQ/15ML (20%) SOLN Take 7.5 mLs by mouth daily. (Patient taking differently: Take 7.5 mLs by mouth daily. Per patient taking 1/2 of 7.5 mLs daily.)   trimethoprim (TRIMPEX) 100 MG tablet Take 50 mg by mouth as needed.   [DISCONTINUED] aspirin EC 81 MG tablet Take 1 tablet (81 mg total) by mouth daily. Swallow whole.   [DISCONTINUED] ondansetron (ZOFRAN) 4 MG tablet Take 4 mg by mouth every 6 (six) hours as needed.     Allergies:   Cephalexin, Egg-derived products, Milk (cow), Milk-related compounds, and Sulfa antibiotics   Social History   Socioeconomic History   Marital status: Divorced    Spouse name: Not on file   Number of children: Not on file   Years of education: Not on file   Highest education level: Not on file  Occupational History   Not on file  Tobacco Use   Smoking status: Former    Types: Cigarettes    Passive exposure: Never   Smokeless tobacco: Never  Vaping Use   Vaping Use: Never used  Substance and Sexual Activity   Alcohol use: No   Drug use: No   Sexual activity: Not on file  Other Topics Concern   Not on file  Social History Narrative   Not on file   Social Determinants of Health   Financial Resource Strain: Not on file  Food Insecurity: Not on file  Transportation Needs: Not on file  Physical Activity: Not on file  Stress: Not on file  Social Connections: Not on file     Family History: The patient's family history includes Cancer in her father and mother; Diabetes in her brother; Hypertension in her brother and mother; Stroke in her brother and mother.  ROS:   Please see the history of present illness.     All other systems reviewed and are negative.  EKGs/Labs/Other Studies Reviewed:    The following studies were reviewed today:   EKG:   04/25/2022: Sinus rhythm, first-degree AV block, rate 86, poor R wave progression, left axis deviation 02/01/23: Sinus  tachycardia, rate 102, right axis deviation, poor R wave progression  Recent Labs: 04/13/2022: ALT 10; B Natriuretic Peptide 3,564.0 04/25/2022: BUN 31; Creatinine, Ser 1.99; Magnesium 2.3; NT-Pro BNP 48,723; Potassium 4.7; Sodium 136  Recent Lipid Panel    Component Value Date/Time   CHOL 173 09/12/2021 1128   TRIG 78 09/12/2021 1128   HDL 58 09/12/2021 1128   CHOLHDL 3.0 09/12/2021 1128   VLDL 16 09/12/2021 1128   LDLCALC 99 09/12/2021 1128    Physical Exam:    VS:  BP 114/64   Pulse (!) 102   Ht 5\' 4"  (1.626 m)   Wt 118 lb 9.6 oz (53.8 kg)   SpO2 97%   BMI 20.36 kg/m     Wt Readings from Last  3 Encounters:  02/01/23 118 lb 9.6 oz (53.8 kg)  08/02/22 111 lb 9.6 oz (50.6 kg)  04/25/22 121 lb 3.2 oz (55 kg)     GEN:  in no acute distress HEENT: Normal NECK: No JVD CARDIAC: RRR, no murmurs, rubs, gallops RESPIRATORY: CTAB  ABDOMEN: Soft, non-tender, non-distended MUSCULOSKELETAL:  Trace edema NEUROLOGIC:  Alert and oriented x 3 PSYCHIATRIC:  Normal affect   ASSESSMENT:    1. Chronic combined systolic and diastolic heart failure   2. Nonrheumatic mitral valve regurgitation   3. Stage 3b chronic kidney disease     PLAN:    Chronic combined systolic and diastolic heart failure: Echocardiogram on 09/20/2021 showed EF 20 to 25%, mild LV dilatation, moderate RV dysfunction, RVSP 61 mmHg, severe left atrial enlargement, small pericardial effusion, severe MR, mild AI.  Cardiac catheterization on 10/09/2021, showed 50% proximal LAD 50% percent OM1, 60% proximal to distal LCx, 40% proximal RCA, 30% mid RCA, 50% RPDA.  RHC showed RA 4, RV 48/2, PA 48/19/33, PCWP 14, LVEDP 14, CI 3.2.  Echocardiogram 07/26/2022 (study read but report not transferring to epic) showed EF 25 to 30%, moderate to severe LV dilatation, normal RV function, severe left atrial enlargement, small pericardial effusion, moderate mitral regurgitation. -Continue losartan 25 mg daily.   -Unable to tolerate  low-dose Toprol-XL due to fatigue. -Hold off on spironolactone given renal function -Reported intolerance to Farxiga 10 mg daily but tolerating 5 mg daily.  She does have a history of UTIs, discussed to let us know if having issues with UTIs on Farxiga -Can continue Lasix 20 mg daily as needed if gains more than 3 pounds in 1 day or 5 pounds in 1 week, or if worsening lower extremity edema.  Reports she has had some recent edema and has been taking.  Will check BMET/magnesium -Recommend cardiac MRI to work-up nonischemic cardiomyopathy but she declines at this time -Given her EF remains less than 35% on echo 07/26/2022, discussed that she meets criteria for ICD but she is not interested at this time  Mitral regurgitation: Severe on echo 09/20/2021.  Appears functional.  Repeat echo 07/26/2022 shows moderate mitral regurgitation  Nonobstructive CAD: Cardiac catheterization on 10/09/2021, showed 50% proximal LAD 50% percent OM1, 60% proximal to distal LCx, 40% proximal RCA, 30% mid RCA, 50% RPDA -Continue aspirin, she reports she stopped taking due to nosebleeds.  Recommend trial of 81 mg every other day -Intolerance to statin.  Referred to pharmacy lipid clinic for PCSK9 inhibitor, but patient declines  CKD stage IIIb: Creatinine 1.9 on 07/16/2022.  Check BMET  Hyperlipidemia: LDL 101 on 07/17/22, has been as high as 180.  Unable to tolerate statins.  Referred to pharmacy for PCSK9 inhibitor  Carotid stenosis: Carotid duplex 08/2015 showed 1 to 39% bilateral internal carotid artery stenosis.  Carotid duplex 01/25/2022 showed discordant results: Peak velocities suggesting no significant stenosis, with ICA/CCA ratio suggesting 50 to 69% stenosis.  Will monitor   RTC in 3 months    Medication Adjustments/Labs and Tests Ordered: Current medicines are reviewed at length with the patient today.  Concerns regarding medicines are outlined above.  Orders Placed This Encounter  Procedures   Basic  metabolic panel   Magnesium   CBC   EKG 12-Lead    Meds ordered this encounter  Medications   aspirin EC 81 MG tablet    Sig: Take 1 tablet (81 mg total) by mouth every other day. Swallow whole.     Patient Instructions  Medication Instructions:  RESTART aspirin 81 mg every other day  *If you need a refill on your cardiac medications before your next appointment, please call your pharmacy*  Lab Work: BMET, Mag, CBC today  If you have labs (blood work) drawn today and your tests are completely normal, you will receive your results only by: MyChart Message (if you have MyChart) OR A paper copy in the mail If you have any lab test that is abnormal or we need to change your treatment, we will call you to review the results.  Follow-Up: At [facility], you and your health needs are our priority.  As part of our continuing mission to provide you with exceptional heart care, we have created designated Provider Care Teams.  These Care Teams include your primary Cardiologist (physician) and Advanced Practice Providers (APPs -  Physician Assistants and Nurse Practitioners) who all work together to provide you with the care you need, when you need it.  We recommend signing up for the patient portal called "MyChart".  Sign up information is provided on this After Visit Summary.  MyChart is used to connect with patients for Virtual Visits (Telemedicine).  Patients are able to view lab/test results, encounter notes, upcoming appointments, etc.  Non-urgent messages can be sent to your provider as well.   To learn more about what you can do with MyChart, go to [email or web address].    Your next appointment:   3 month(s)  Provider:   Dr. Bjorn Pippin    Signed, Little Ishikawa, MD  02/01/2023 5:05 PM    [facility]

## 2023-02-01 ENCOUNTER — Ambulatory Visit: Payer: Medicare Other | Attending: Cardiology | Admitting: Cardiology

## 2023-02-01 ENCOUNTER — Encounter: Payer: Self-pay | Admitting: Cardiology

## 2023-02-01 VITALS — BP 114/64 | HR 102 | Ht 64.0 in | Wt 118.6 lb

## 2023-02-01 DIAGNOSIS — I5042 Chronic combined systolic (congestive) and diastolic (congestive) heart failure: Secondary | ICD-10-CM

## 2023-02-01 DIAGNOSIS — N1832 Chronic kidney disease, stage 3b: Secondary | ICD-10-CM | POA: Diagnosis not present

## 2023-02-01 DIAGNOSIS — I340 Nonrheumatic mitral (valve) insufficiency: Secondary | ICD-10-CM | POA: Diagnosis not present

## 2023-02-01 MED ORDER — ASPIRIN 81 MG PO TBEC: 81.0000 mg | DELAYED_RELEASE_TABLET | ORAL | Status: DC

## 2023-02-01 NOTE — Patient Instructions (Signed)
Medication Instructions:  RESTART aspirin 81 mg every other day  *If you need a refill on your cardiac medications before your next appointment, please call your pharmacy*  Lab Work: BMET, Mag, CBC today  If you have labs (blood work) drawn today and your tests are completely normal, you will receive your results only by: MyChart Message (if you have MyChart) OR A paper copy in the mail If you have any lab test that is abnormal or we need to change your treatment, we will call you to review the results.  Follow-Up: At [facility], you and your health needs are our priority.  As part of our continuing mission to provide you with exceptional heart care, we have created designated Provider Care Teams.  These Care Teams include your primary Cardiologist (physician) and Advanced Practice Providers (APPs -  Physician Assistants and Nurse Practitioners) who all work together to provide you with the care you need, when you need it.  We recommend signing up for the patient portal called "MyChart".  Sign up information is provided on this After Visit Summary.  MyChart is used to connect with patients for Virtual Visits (Telemedicine).  Patients are able to view lab/test results, encounter notes, upcoming appointments, etc.  Non-urgent messages can be sent to your provider as well.   To learn more about what you can do with MyChart, go to [email or web address].    Your next appointment:   3 month(s)  Provider:   Dr. Bjorn Pippin

## 2023-02-02 LAB — CBC
Hematocrit: 40.2 % (ref 34.0–46.6)
Hemoglobin: 12.9 g/dL (ref 11.1–15.9)
MCH: 29.1 pg (ref 26.6–33.0)
MCHC: 32.1 g/dL (ref 31.5–35.7)
MCV: 91 fL (ref 79–97)
Platelets: 185 10*3/uL (ref 150–450)
RBC: 4.43 x10E6/uL (ref 3.77–5.28)
RDW: 13.2 % (ref 11.7–15.4)
WBC: 5.5 10*3/uL (ref 3.4–10.8)

## 2023-02-02 LAB — BASIC METABOLIC PANEL
BUN/Creatinine Ratio: 11 — ABNORMAL LOW (ref 12–28)
BUN: 23 mg/dL (ref 8–27)
CO2: 31 mmol/L — ABNORMAL HIGH (ref 20–29)
Calcium: 10.1 mg/dL (ref 8.7–10.3)
Chloride: 91 mmol/L — ABNORMAL LOW (ref 96–106)
Creatinine, Ser: 2.09 mg/dL — ABNORMAL HIGH (ref 0.57–1.00)
Glucose: 99 mg/dL (ref 70–99)
Potassium: 4.5 mmol/L (ref 3.5–5.2)
Sodium: 139 mmol/L (ref 134–144)
eGFR: 24 mL/min/{1.73_m2} — ABNORMAL LOW (ref >59)

## 2023-02-02 LAB — MAGNESIUM: Magnesium: 2 mg/dL (ref 1.6–2.3)

## 2023-02-04 ENCOUNTER — Other Ambulatory Visit: Payer: Self-pay | Admitting: *Deleted

## 2023-02-04 DIAGNOSIS — N1832 Chronic kidney disease, stage 3b: Secondary | ICD-10-CM

## 2023-02-04 DIAGNOSIS — Z79899 Other long term (current) drug therapy: Secondary | ICD-10-CM

## 2023-02-04 DIAGNOSIS — I5042 Chronic combined systolic (congestive) and diastolic (congestive) heart failure: Secondary | ICD-10-CM

## 2023-02-04 MED ORDER — FUROSEMIDE 20 MG PO TABS: 20.0000 mg | ORAL_TABLET | ORAL | 3 refills | Status: DC | PRN

## 2023-02-04 MED ORDER — POTASSIUM CHLORIDE 40 MEQ/15ML (20%) PO SOLN: 3.5000 mL | ORAL | Status: AC | PRN

## 2023-02-27 ENCOUNTER — Encounter: Payer: Self-pay | Admitting: *Deleted

## 2023-02-27 ENCOUNTER — Other Ambulatory Visit: Payer: Self-pay | Admitting: *Deleted

## 2023-02-27 DIAGNOSIS — I5042 Chronic combined systolic (congestive) and diastolic (congestive) heart failure: Secondary | ICD-10-CM

## 2023-02-27 DIAGNOSIS — Z79899 Other long term (current) drug therapy: Secondary | ICD-10-CM

## 2023-02-27 DIAGNOSIS — N1832 Chronic kidney disease, stage 3b: Secondary | ICD-10-CM

## 2023-03-06 DIAGNOSIS — E782 Mixed hyperlipidemia: Secondary | ICD-10-CM | POA: Diagnosis not present

## 2023-03-07 LAB — LAB REPORT - SCANNED
Albumin, Urine POC: 1780.6
Creatinine, POC: 206.3 mg/dL
EGFR: 25
Microalb Creat Ratio: 863

## 2023-03-12 DIAGNOSIS — I130 Hypertensive heart and chronic kidney disease with heart failure and stage 1 through stage 4 chronic kidney disease, or unspecified chronic kidney disease: Secondary | ICD-10-CM | POA: Diagnosis not present

## 2023-03-12 DIAGNOSIS — N184 Chronic kidney disease, stage 4 (severe): Secondary | ICD-10-CM | POA: Diagnosis not present

## 2023-03-12 DIAGNOSIS — Z0001 Encounter for general adult medical examination with abnormal findings: Secondary | ICD-10-CM | POA: Diagnosis not present

## 2023-03-12 DIAGNOSIS — D509 Iron deficiency anemia, unspecified: Secondary | ICD-10-CM | POA: Diagnosis not present

## 2023-03-12 DIAGNOSIS — I10 Essential (primary) hypertension: Secondary | ICD-10-CM | POA: Diagnosis not present

## 2023-03-12 DIAGNOSIS — J449 Chronic obstructive pulmonary disease, unspecified: Secondary | ICD-10-CM | POA: Diagnosis not present

## 2023-03-20 NOTE — Telephone Encounter (Signed)
See result note.  

## 2023-04-08 DIAGNOSIS — N390 Urinary tract infection, site not specified: Secondary | ICD-10-CM | POA: Diagnosis not present

## 2023-04-08 DIAGNOSIS — Z09 Encounter for follow-up examination after completed treatment for conditions other than malignant neoplasm: Secondary | ICD-10-CM | POA: Diagnosis not present

## 2023-04-08 DIAGNOSIS — N289 Disorder of kidney and ureter, unspecified: Secondary | ICD-10-CM | POA: Diagnosis not present

## 2023-05-10 ENCOUNTER — Other Ambulatory Visit: Payer: Self-pay | Admitting: Adult Health

## 2023-05-10 DIAGNOSIS — J040 Acute laryngitis: Secondary | ICD-10-CM | POA: Diagnosis not present

## 2023-05-10 DIAGNOSIS — J309 Allergic rhinitis, unspecified: Secondary | ICD-10-CM | POA: Diagnosis not present

## 2023-05-10 DIAGNOSIS — I110 Hypertensive heart disease with heart failure: Secondary | ICD-10-CM | POA: Diagnosis not present

## 2023-05-10 DIAGNOSIS — I5042 Chronic combined systolic (congestive) and diastolic (congestive) heart failure: Secondary | ICD-10-CM | POA: Diagnosis not present

## 2023-05-10 DIAGNOSIS — I10 Essential (primary) hypertension: Secondary | ICD-10-CM | POA: Diagnosis not present

## 2023-05-12 NOTE — Progress Notes (Unsigned)
Cardiology Office Note:    Date:  05/14/2023   ID:  [name], Powell [birth year], MRN [id number]  PCP:  Caitlin Stabile, MD  Cardiologist:  None  Electrophysiologist:  None   Referring MD: Caitlin Stabile, MD   Chief Complaint  Patient presents with   Congestive Heart Failure    History of Present Illness:    [name] is a 77 y.o. female with a hx of hypertension, nonobstructive CAD, hyperlipidemia who presents for follow-up.  She was initially seen on 09/08/2021.  She reports she has been having dyspnea for last several months.  States that she is feels short of breath with minimal exertion.  Reports had episode of what she thinks was gastritis in July 2022, which he describes as pain in her upper abdomen and chest, but no chest pain since that time.  She denies any lightheadedness, syncope, or palpitations.  Has noted swelling in her left leg.  She smoked for over 40 years, quit in 2009.  Echocardiogram 06/27/2012 showed EF 65 to 70%, normal RV function, no significant valvular disease.  Carotid duplex 08/2015 showed 1 to 39% bilateral internal carotid artery stenosis.  Echocardiogram on 09/20/2021 showed EF 20 to 25%, mild LV dilatation, moderate RV dysfunction, RVSP 61 mmHg, severe left atrial enlargement, small pericardial effusion, severe MR, mild AI.  Labs on 09/12/2021 showed creatinine 1.77 (increased from 1.55 in August), BNP 2321, hemoglobin 10.3.  Given her severe systolic heart failure and need for diuresis and tenuous kidney function, recommended admission.  She declined.  Cardiac catheterization was done as an outpatient on 10/09/2021, showed 50% proximal LAD 50% percent OM1, 60% proximal to distal LCx, 40% proximal RCA, 30% mid RCA, 50% RPDA.  RHC showed RA 4, RV 48/2, PA 48/19/33, PCWP 14, LVEDP 14, CI 3.2.  Echocardiogram 07/26/2022 (study read but report not transferring to epic) showed EF 25 to 30%, moderate to severe LV dilatation, normal RV function, severe left atrial  enlargement, small pericardial effusion, moderate mitral regurgitation.  Since last clinic visit, she reports he has been doing okay.  Denies any chest pain, dyspnea, lightheadedness, syncope, or palpitations.  Reports had some lower extremity edema, took Lasix for 3 days and has improved.  Wt Readings from Last 3 Encounters:  05/14/23 120 lb (54.4 kg)  02/01/23 118 lb 9.6 oz (53.8 kg)  08/02/22 111 lb 9.6 oz (50.6 kg)     Past Medical History:  Diagnosis Date   Cataract of right eye    Essential hypertension, benign    History of cardiac catheterization    Reportedly no significant CAD 1998 - previously followed with Dr. Corinda Powell   Mixed hyperlipidemia    Osteoporosis    Recurrent UTI     Past Surgical History:  Procedure Laterality Date   ABDOMINAL HYSTERECTOMY     CATARACT EXTRACTION W/PHACO  09/17/2011   Procedure: CATARACT EXTRACTION PHACO AND INTRAOCULAR LENS PLACEMENT (IOC);  Surgeon: [name];  Location: [facility];  Service: Ophthalmology;  Laterality: Right;  CDE:23.52   CYSTOSCOPY     FOOT SURGERY     LIPOMA EXCISION     Back   RIGHT/LEFT HEART CATH AND CORONARY ANGIOGRAPHY N/A 10/09/2021   Procedure: RIGHT/LEFT HEART CATH AND CORONARY ANGIOGRAPHY;  Surgeon: Caitlin Hazel, MD;  Location: [facility];  Service: Cardiovascular;  Laterality: N/A;    Current Medications: Current Meds  Medication Sig   aspirin EC 81 MG tablet Take 1 tablet (81 mg total)  by mouth every other day. Swallow whole.   dapagliflozin propanediol (FARXIGA) 10 MG TABS tablet Take 1 tablet (10 mg total) by mouth daily before breakfast.   furosemide (LASIX) 20 MG tablet Take 1 tablet (20 mg total) by mouth as needed (weight increase of 3 lbs overnight or 5 lbs in 1 week).   losartan (COZAAR) 25 MG tablet TAKE 1 TABLET BY MOUTH DAILY.   Potassium Chloride 40 MEQ/15ML (20%) SOLN Take 3.5 mLs by mouth as needed (with lasix).   trimethoprim (TRIMPEX) 100 MG tablet Take 50 mg by mouth as  needed.   [DISCONTINUED] dapagliflozin propanediol (FARXIGA) 5 MG TABS tablet TAKE 1 TABLET DAILY BEFORE BREAKFAST.     Allergies:   Cephalexin, Egg-derived products, Milk (cow), Milk-related compounds, and Sulfa antibiotics   Social History   Socioeconomic History   Marital status: Divorced    Spouse name: Not on file   Number of children: Not on file   Years of education: Not on file   Highest education level: Not on file  Occupational History   Not on file  Tobacco Use   Smoking status: Former    Types: Cigarettes    Passive exposure: Never   Smokeless tobacco: Never  Vaping Use   Vaping status: Never Used  Substance and Sexual Activity   Alcohol use: No   Drug use: No   Sexual activity: Not on file  Other Topics Concern   Not on file  Social History Narrative   Not on file   Social Determinants of Health   Financial Resource Strain: Not on file  Food Insecurity: Not on file  Transportation Needs: Not on file  Physical Activity: Not on file  Stress: Not on file  Social Connections: Not on file     Family History: The patient's family history includes Cancer in her father and mother; Diabetes in her brother; Hypertension in her brother and mother; Stroke in her brother and mother.  ROS:   Please see the history of present illness.     All other systems reviewed and are negative.  EKGs/Labs/Other Studies Reviewed:    The following studies were reviewed today:   EKG:   04/25/2022: Sinus rhythm, first-degree AV block, rate 86, poor R wave progression, left axis deviation 02/01/23: Sinus tachycardia, rate 102, right axis deviation, poor R wave progression 05/14/23: Sinus tachycardia, rate 106, PVC, poor R wave progression  Recent Labs: 02/01/2023: BUN 23; Creatinine, Ser 2.09; Hemoglobin 12.9; Magnesium 2.0; Platelets 185; Potassium 4.5; Sodium 139  Recent Lipid Panel    Component Value Date/Time   CHOL 173 09/12/2021 1128   TRIG 78 09/12/2021 1128   HDL 58  09/12/2021 1128   CHOLHDL 3.0 09/12/2021 1128   VLDL 16 09/12/2021 1128   LDLCALC 99 09/12/2021 1128    Physical Exam:    VS:  BP 120/72 (BP Location: Left Arm, Patient Position: Sitting, Cuff Size: Normal)   Pulse (!) 106   Ht 5\' 4"  (1.626 m)   Wt 120 lb (54.4 kg)   SpO2 (!) 81%   BMI 20.60 kg/m     Wt Readings from Last 3 Encounters:  05/14/23 120 lb (54.4 kg)  02/01/23 118 lb 9.6 oz (53.8 kg)  08/02/22 111 lb 9.6 oz (50.6 kg)     GEN:  in no acute distress HEENT: Normal NECK: No JVD CARDIAC: RRR, no murmurs, rubs, gallops RESPIRATORY: CTAB  ABDOMEN: Soft, non-tender, non-distended MUSCULOSKELETAL:  Trace edema NEUROLOGIC:  Alert and oriented x  3 PSYCHIATRIC:  Normal affect   ASSESSMENT:    1. Chronic combined systolic and diastolic heart failure (HCC)   2. Atherosclerosis of native coronary artery of native heart, unspecified whether angina present   3. Nonrheumatic mitral valve regurgitation   4. Chronic kidney disease (CKD), stage IV (severe) (HCC)   5. Hyperlipidemia, unspecified hyperlipidemia type      PLAN:    Chronic combined systolic and diastolic heart failure: Echocardiogram on 09/20/2021 showed EF 20 to 25%, mild LV dilatation, moderate RV dysfunction, RVSP 61 mmHg, severe left atrial enlargement, small pericardial effusion, severe MR, mild AI.  Cardiac catheterization on 10/09/2021, showed 50% proximal LAD 50% percent OM1, 60% proximal to distal LCx, 40% proximal RCA, 30% mid RCA, 50% RPDA.  RHC showed RA 4, RV 48/2, PA 48/19/33, PCWP 14, LVEDP 14, CI 3.2.  Echocardiogram 07/26/2022 (study read but report not transferring to epic) showed EF 25 to 30%, moderate to severe LV dilatation, normal RV function, severe left atrial enlargement, small pericardial effusion, moderate mitral regurgitation. -Continue losartan 25 mg daily.   -Unable to tolerate low-dose Toprol-XL due to fatigue. -Hold off on spironolactone given renal function -Reported intolerance to  Farxiga 10 mg daily but tolerating 5 mg daily; she is agreeable to trying 10 mg daily. She does have a history of UTIs, discussed to let us know if having issues with UTIs on Farxiga -Can continue Lasix 20 mg daily as needed if gains more than 3 pounds in 1 day or 5 pounds in 1 week, or if worsening lower extremity edema.  Check BMET/magnesium -Recommend cardiac MRI to work-up nonischemic cardiomyopathy but she declines at this time -Given her EF remains less than 35% on echo 07/26/2022, discussed that she meets criteria for ICD but she is not interested at this time -Update echocardiogram prior to next clinic visit  Mitral regurgitation: Severe on echo 09/20/2021.  Appears functional.  Repeat echo 07/26/2022 shows moderate mitral regurgitation  Nonobstructive CAD: Cardiac catheterization on 10/09/2021, showed 50% proximal LAD 50% percent OM1, 60% proximal to distal LCx, 40% proximal RCA, 30% mid RCA, 50% RPDA -Continue aspirin, she reports she stopped taking due to nosebleeds.  Recommend trial of 81 mg every other day -Intolerance to statin.  Referred to pharmacy lipid clinic for PCSK9 inhibitor, but patient declines.  Cholesterol has improved significantly, LDL 51 on 03/06/2023  CKD stage IV: Creatinine 2.09 on 02/01/2023.  Check BMET  Hyperlipidemia: LDL 101 on 07/17/22, has been as high as 180.  Unable to tolerate statins.  Referred to pharmacy for PCSK9 inhibitor.  Cholesterol has improved significantly, LDL 51 on 03/06/2023  Carotid stenosis: Carotid duplex 08/2015 showed 1 to 39% bilateral internal carotid artery stenosis.  Carotid duplex 01/25/2022 showed discordant results: Peak velocities suggesting no significant stenosis, with ICA/CCA ratio suggesting 50 to 69% stenosis.  Will monitor   RTC in 3 months    Medication Adjustments/Labs and Tests Ordered: Current medicines are reviewed at length with the patient today.  Concerns regarding medicines are outlined above.  Orders Placed This  Encounter  Procedures   Basic metabolic panel   Magnesium   B Nat Peptide   EKG 12-Lead   ECHOCARDIOGRAM COMPLETE    Meds ordered this encounter  Medications   dapagliflozin propanediol (FARXIGA) 10 MG TABS tablet    Sig: Take 1 tablet (10 mg total) by mouth daily before breakfast.    Dispense:  90 tablet    Refill:  3     Patient Instructions  Medication Instructions:  Increase Farxiga to 10 mg daily in morning before breakfast Continue all other medications *If you need a refill on your cardiac medications before your next appointment, please call your pharmacy*   Lab Work: Have bmet,magnesium,bnp on Thursday 7/25   Testing/Procedures: Schedule Echo in 3 months   Follow-Up: At [facility], you and your health needs are our priority.  As part of our continuing mission to provide you with exceptional heart care, we have created designated Provider Care Teams.  These Care Teams include your primary Cardiologist (physician) and Advanced Practice Providers (APPs -  Physician Assistants and Nurse Practitioners) who all work together to provide you with the care you need, when you need it.  We recommend signing up for the patient portal called "MyChart".  Sign up information is provided on this After Visit Summary.  MyChart is used to connect with patients for Virtual Visits (Telemedicine).  Patients are able to view lab/test results, encounter notes, upcoming appointments, etc.  Non-urgent messages can be sent to your provider as well.   To learn more about what you can do with MyChart, go to [email or web address].    Your next appointment:  3 months after Echo    Provider:  Dr.Luddie Boghosian     Signed, Little Ishikawa, MD  05/14/2023 5:21 PM    [facility]

## 2023-05-14 ENCOUNTER — Encounter: Payer: Self-pay | Admitting: Cardiology

## 2023-05-14 ENCOUNTER — Ambulatory Visit: Payer: Medicare Other | Attending: Cardiology | Admitting: Cardiology

## 2023-05-14 VITALS — BP 120/72 | HR 106 | Ht 64.0 in | Wt 120.0 lb

## 2023-05-14 DIAGNOSIS — E785 Hyperlipidemia, unspecified: Secondary | ICD-10-CM

## 2023-05-14 DIAGNOSIS — I340 Nonrheumatic mitral (valve) insufficiency: Secondary | ICD-10-CM

## 2023-05-14 DIAGNOSIS — I2510 Atherosclerotic heart disease of native coronary artery without angina pectoris: Secondary | ICD-10-CM

## 2023-05-14 DIAGNOSIS — I5042 Chronic combined systolic (congestive) and diastolic (congestive) heart failure: Secondary | ICD-10-CM | POA: Diagnosis not present

## 2023-05-14 DIAGNOSIS — N184 Chronic kidney disease, stage 4 (severe): Secondary | ICD-10-CM

## 2023-05-14 MED ORDER — DAPAGLIFLOZIN PROPANEDIOL 10 MG PO TABS: 10.0000 mg | ORAL_TABLET | Freq: Every day | ORAL | 3 refills | Status: DC

## 2023-05-14 NOTE — Patient Instructions (Signed)
Medication Instructions:  Increase Farxiga to 10 mg daily in morning before breakfast Continue all other medications *If you need a refill on your cardiac medications before your next appointment, please call your pharmacy*   Lab Work: Have bmet,magnesium,bnp on Thursday 7/25   Testing/Procedures: Schedule Echo in 3 months   Follow-Up: At [facility], you and your health needs are our priority.  As part of our continuing mission to provide you with exceptional heart care, we have created designated Provider Care Teams.  These Care Teams include your primary Cardiologist (physician) and Advanced Practice Providers (APPs -  Physician Assistants and Nurse Practitioners) who all work together to provide you with the care you need, when you need it.  We recommend signing up for the patient portal called "MyChart".  Sign up information is provided on this After Visit Summary.  MyChart is used to connect with patients for Virtual Visits (Telemedicine).  Patients are able to view lab/test results, encounter notes, upcoming appointments, etc.  Non-urgent messages can be sent to your provider as well.   To learn more about what you can do with MyChart, go to [email or web address].    Your next appointment:  3 months after Echo    Provider:  Dr.Schumann

## 2023-05-16 ENCOUNTER — Other Ambulatory Visit (HOSPITAL_COMMUNITY)
Admission: RE | Admit: 2023-05-16 | Discharge: 2023-05-16 | Disposition: A | Discharge status: Home / Self Care | Payer: Medicare Other | Source: Ambulatory Visit | Attending: Cardiology | Admitting: Cardiology

## 2023-05-16 DIAGNOSIS — I5043 Acute on chronic combined systolic (congestive) and diastolic (congestive) heart failure: Secondary | ICD-10-CM | POA: Diagnosis not present

## 2023-05-16 DIAGNOSIS — I2510 Atherosclerotic heart disease of native coronary artery without angina pectoris: Secondary | ICD-10-CM | POA: Insufficient documentation

## 2023-05-16 LAB — BRAIN NATRIURETIC PEPTIDE: B Natriuretic Peptide: >4500 pg/mL — ABNORMAL HIGH (ref 0.0–100.0)

## 2023-05-16 LAB — BASIC METABOLIC PANEL
Anion gap: 8 (ref 5–15)
BUN: 25 mg/dL — ABNORMAL HIGH (ref 8–23)
CO2: 29 mmol/L (ref 22–32)
Calcium: 8.9 mg/dL (ref 8.9–10.3)
Chloride: 98 mmol/L (ref 98–111)
Creatinine, Ser: 1.83 mg/dL — ABNORMAL HIGH (ref 0.44–1.00)
GFR, Estimated: 28 mL/min — ABNORMAL LOW (ref >60)
Glucose, Bld: 93 mg/dL (ref 70–99)
Potassium: 3.7 mmol/L (ref 3.5–5.1)
Sodium: 135 mmol/L (ref 135–145)

## 2023-05-16 LAB — MAGNESIUM: Magnesium: 2 mg/dL (ref 1.7–2.4)

## 2023-05-17 ENCOUNTER — Other Ambulatory Visit (HOSPITAL_COMMUNITY): Payer: Self-pay | Admitting: *Deleted

## 2023-05-17 DIAGNOSIS — I5042 Chronic combined systolic (congestive) and diastolic (congestive) heart failure: Secondary | ICD-10-CM

## 2023-05-17 DIAGNOSIS — I10 Essential (primary) hypertension: Secondary | ICD-10-CM

## 2023-05-17 DIAGNOSIS — I2510 Atherosclerotic heart disease of native coronary artery without angina pectoris: Secondary | ICD-10-CM

## 2023-05-17 NOTE — Progress Notes (Signed)
Called no answer, order placed

## 2023-05-28 ENCOUNTER — Other Ambulatory Visit (HOSPITAL_COMMUNITY)
Admission: RE | Admit: 2023-05-28 | Discharge: 2023-05-28 | Disposition: A | Discharge status: Home / Self Care | Payer: Medicare Other | Source: Ambulatory Visit | Attending: Cardiology | Admitting: Cardiology

## 2023-05-28 DIAGNOSIS — I2510 Atherosclerotic heart disease of native coronary artery without angina pectoris: Secondary | ICD-10-CM | POA: Insufficient documentation

## 2023-05-28 DIAGNOSIS — I5042 Chronic combined systolic (congestive) and diastolic (congestive) heart failure: Secondary | ICD-10-CM | POA: Diagnosis not present

## 2023-05-28 DIAGNOSIS — I10 Essential (primary) hypertension: Secondary | ICD-10-CM | POA: Diagnosis not present

## 2023-05-28 LAB — BASIC METABOLIC PANEL
Anion gap: 8 (ref 5–15)
BUN: 22 mg/dL (ref 8–23)
CO2: 27 mmol/L (ref 22–32)
Calcium: 9.1 mg/dL (ref 8.9–10.3)
Chloride: 101 mmol/L (ref 98–111)
Creatinine, Ser: 1.71 mg/dL — ABNORMAL HIGH (ref 0.44–1.00)
GFR, Estimated: 30 mL/min — ABNORMAL LOW (ref >60)
Glucose, Bld: 115 mg/dL — ABNORMAL HIGH (ref 70–99)
Potassium: 4 mmol/L (ref 3.5–5.1)
Sodium: 136 mmol/L (ref 135–145)

## 2023-05-28 LAB — MAGNESIUM: Magnesium: 2.1 mg/dL (ref 1.7–2.4)

## 2023-07-30 ENCOUNTER — Ambulatory Visit (HOSPITAL_COMMUNITY)
Admission: RE | Admit: 2023-07-30 | Discharge: 2023-07-30 | Disposition: A | Discharge status: Home / Self Care | Payer: Medicare Other | Source: Ambulatory Visit | Attending: Cardiology | Admitting: Cardiology

## 2023-07-30 DIAGNOSIS — I5042 Chronic combined systolic (congestive) and diastolic (congestive) heart failure: Secondary | ICD-10-CM | POA: Diagnosis not present

## 2023-07-30 DIAGNOSIS — I2510 Atherosclerotic heart disease of native coronary artery without angina pectoris: Secondary | ICD-10-CM | POA: Insufficient documentation

## 2023-07-30 NOTE — Progress Notes (Signed)
*  PRELIMINARY RESULTS* Echocardiogram 2D Echocardiogram has been performed. Patient did not want Definity at this time.  Stacey Drain 07/30/2023, 4:14 PM

## 2023-07-31 LAB — ECHOCARDIOGRAM COMPLETE
Area-P 1/2: 5.66 cm2
Calc EF: 11.8 %
Est EF: <20
MV M vel: 4.83 m/s
MV Peak grad: 93.3 mm[Hg]
Radius: 0.8 cm
S' Lateral: 5.2 cm
Single Plane A2C EF: 0.4 %
Single Plane A4C EF: 18 %

## 2023-08-01 ENCOUNTER — Telehealth: Payer: Self-pay | Admitting: Cardiology

## 2023-08-01 ENCOUNTER — Other Ambulatory Visit: Payer: Self-pay

## 2023-08-01 DIAGNOSIS — I513 Intracardiac thrombosis, not elsewhere classified: Secondary | ICD-10-CM

## 2023-08-01 MED ORDER — APIXABAN 5 MG PO TABS: 5.0000 mg | ORAL_TABLET | Freq: Two times a day (BID) | ORAL | 3 refills | Status: DC

## 2023-08-01 NOTE — Telephone Encounter (Signed)
Message sent to Dr.Schumann to make him aware.

## 2023-08-01 NOTE — Telephone Encounter (Signed)
Patient's daughter is returning call to speak with Elnita Maxwell, RN in regards to echo results and future appt for echo bubble. She states that her mother will not be doing the echo bubble, but she will follow up with Dr. Bjorn Pippin on 10/17.  Patient's daughter stated she was at work currently, and does not need call back, but if you need to discuss anything further to please reach out.

## 2023-08-01 NOTE — Telephone Encounter (Signed)
Pt states she does not want anymore automated phone calls to remind her about her appointment. Please advise

## 2023-08-01 NOTE — Telephone Encounter (Signed)
Clinical team/triage is not in charge of automated phone call reminders. Routed back to scheduling team and manager to address with patient.

## 2023-08-07 ENCOUNTER — Other Ambulatory Visit: Payer: Medicare Other

## 2023-08-07 NOTE — Progress Notes (Signed)
Cardiology Office Note:    Date:  08/09/2023   ID:  [name], [name] April 27, 1946, MRN [id number]  PCP:  Little Ishikawa, MD  Cardiologist:  None  Electrophysiologist:  None   Referring MD: Benita Stabile, MD   Chief Complaint  Patient presents with   Follow-up    3 months.   Shortness of Breath   Edema    History of Present Illness:    [name] is a 76 y.o. female with a hx of hypertension, nonobstructive CAD, hyperlipidemia who presents for follow-up.  She was initially seen on 09/08/2021.  She reports she has been having dyspnea for last several months.  States that she is feels short of breath with minimal exertion.  Reports had episode of what she thinks was gastritis in July 2022, which he describes as pain in her upper abdomen and chest, but no chest pain since that time.  She denies any lightheadedness, syncope, or palpitations.  Has noted swelling in her left leg.  She smoked for over 40 years, quit in 2009.  Echocardiogram 06/27/2012 showed EF 65 to 70%, normal RV function, no significant valvular disease.  Carotid duplex 08/2015 showed 1 to 39% bilateral internal carotid artery stenosis.  Echocardiogram on 09/20/2021 showed EF 20 to 25%, mild LV dilatation, moderate RV dysfunction, RVSP 61 mmHg, severe left atrial enlargement, small pericardial effusion, severe MR, mild AI.  Labs on 09/12/2021 showed creatinine 1.77 (increased from 1.55 in August), BNP 2321, hemoglobin 10.3.  Given her severe systolic heart failure and need for diuresis and tenuous kidney function, recommended admission.  She declined.  Cardiac catheterization was done as an outpatient on 10/09/2021, showed 50% proximal LAD 50% percent OM1, 60% proximal to distal LCx, 40% proximal RCA, 30% mid RCA, 50% RPDA.  RHC showed RA 4, RV 48/2, PA 48/19/33, PCWP 14, LVEDP 14, CI 3.2.  Echocardiogram 07/26/2022 (study read but report not transferring to epic) showed EF 25 to 30%, moderate to severe LV dilatation,  normal RV function, severe left atrial enlargement, small pericardial effusion, moderate mitral regurgitation.  Echocardiogram 07/30/2023 showed EF less than 20%, severe RV dysfunction, RVSP 52, severe biatrial enlargement, moderate left pleural effusion, RAP 8, suspected left atrial thrombus (declined Definity).  Since last clinic visit, she reports she has not been taking Lasix, noted worsening swelling.  She started Eliquis, denies any bleeding issues.  Denies any chest pain, lightheadedness, syncope, or palpitations.  Does report feeling more short of breath.  Wt Readings from Last 3 Encounters:  08/08/23 122 lb (55.3 kg)  05/14/23 120 lb (54.4 kg)  02/01/23 118 lb 9.6 oz (53.8 kg)     Past Medical History:  Diagnosis Date   Cataract of right eye    Essential hypertension, benign    History of cardiac catheterization    Reportedly no significant CAD 1998 - previously followed with Dr. Corinda Gubler   Mixed hyperlipidemia    Osteoporosis    Recurrent UTI     Past Surgical History:  Procedure Laterality Date   ABDOMINAL HYSTERECTOMY     CATARACT EXTRACTION W/PHACO  09/17/2011   Procedure: CATARACT EXTRACTION PHACO AND INTRAOCULAR LENS PLACEMENT (IOC);  Surgeon: Gemma Payor;  Location: [facility];  Service: Ophthalmology;  Laterality: Right;  CDE:23.52   CYSTOSCOPY     FOOT SURGERY     LIPOMA EXCISION     Back   RIGHT/LEFT HEART CATH AND CORONARY ANGIOGRAPHY N/A 10/09/2021   Procedure: RIGHT/LEFT HEART CATH AND CORONARY ANGIOGRAPHY;  Surgeon: Kathleene Hazel, MD;  Location: [facility] INVASIVE CV LAB;  Service: Cardiovascular;  Laterality: N/A;    Current Medications: Current Meds  Medication Sig   apixaban (ELIQUIS) 5 MG TABS tablet Take 1 tablet (5 mg total) by mouth 2 (two) times daily.   dapagliflozin propanediol (FARXIGA) 10 MG TABS tablet Take 1 tablet (10 mg total) by mouth daily before breakfast.   furosemide (LASIX) 20 MG tablet Take 1 tablet (20 mg total) by mouth daily.    losartan (COZAAR) 25 MG tablet TAKE 1 TABLET BY MOUTH DAILY.   Potassium Chloride 40 MEQ/15ML (20%) SOLN Take 3.5 mLs by mouth as needed (with lasix).   trimethoprim (TRIMPEX) 100 MG tablet Take 50 mg by mouth as needed.   [DISCONTINUED] furosemide (LASIX) 20 MG tablet Take 1 tablet (20 mg total) by mouth as needed (weight increase of 3 lbs overnight or 5 lbs in 1 week).     Allergies:   Cephalexin, Egg-derived products, Milk (cow), Milk-related compounds, and Sulfa antibiotics   Social History   Socioeconomic History   Marital status: Divorced    Spouse name: Not on file   Number of children: Not on file   Years of education: Not on file   Highest education level: Not on file  Occupational History   Not on file  Tobacco Use   Smoking status: Former    Types: Cigarettes    Passive exposure: Never   Smokeless tobacco: Never  Vaping Use   Vaping status: Never Used  Substance and Sexual Activity   Alcohol use: No   Drug use: No   Sexual activity: Not on file  Other Topics Concern   Not on file  Social History Narrative   Not on file   Social Determinants of Health   Financial Resource Strain: Not on file  Food Insecurity: Not on file  Transportation Needs: Not on file  Physical Activity: Not on file  Stress: Not on file  Social Connections: Not on file     Family History: The patient's family history includes Cancer in her father and mother; Diabetes in her brother; Hypertension in her brother and mother; Stroke in her brother and mother.  ROS:   Please see the history of present illness.     All other systems reviewed and are negative.  EKGs/Labs/Other Studies Reviewed:    The following studies were reviewed today:   EKG:   04/25/2022: Sinus rhythm, first-degree AV block, rate 86, poor R wave progression, left axis deviation 02/01/23: Sinus tachycardia, rate 102, right axis deviation, poor R wave progression 05/14/23: Sinus tachycardia, rate 106, PVC, poor R wave  progression  Recent Labs: 02/01/2023: Hemoglobin 12.9; Platelets 185 05/16/2023: B Natriuretic Peptide >4,500.0 05/28/2023: BUN 22; Creatinine, Ser 1.71; Magnesium 2.1; Potassium 4.0; Sodium 136  Recent Lipid Panel    Component Value Date/Time   CHOL 173 09/12/2021 1128   TRIG 78 09/12/2021 1128   HDL 58 09/12/2021 1128   CHOLHDL 3.0 09/12/2021 1128   VLDL 16 09/12/2021 1128   LDLCALC 99 09/12/2021 1128    Physical Exam:    VS:  BP 112/62 (BP Location: Right Arm, Patient Position: Sitting, Cuff Size: Normal)   Pulse 80   Ht 5\' 4"  (1.626 m)   Wt 122 lb (55.3 kg)   BMI 20.94 kg/m     Wt Readings from Last 3 Encounters:  08/08/23 122 lb (55.3 kg)  05/14/23 120 lb (54.4 kg)  02/01/23 118 lb 9.6 oz (53.8  kg)     GEN:  in no acute distress HEENT: Normal NECK: No JVD CARDIAC: RRR, no murmurs, rubs, gallops RESPIRATORY: CTAB  ABDOMEN: Soft, non-tender, non-distended MUSCULOSKELETAL:  Trace edema NEUROLOGIC:  Alert and oriented x 3 PSYCHIATRIC:  Normal affect   ASSESSMENT:    1. Chronic combined systolic and diastolic heart failure (HCC)   2. Left ventricular thrombus   3. Hyperlipidemia, unspecified hyperlipidemia type   4. Essential hypertension, benign   5. Chronic kidney disease (CKD), stage IV (severe) (HCC)       PLAN:    Chronic combined systolic and diastolic heart failure: Echocardiogram on 09/20/2021 showed EF 20 to 25%, mild LV dilatation, moderate RV dysfunction, RVSP 61 mmHg, severe left atrial enlargement, small pericardial effusion, severe MR, mild AI.  Cardiac catheterization on 10/09/2021, showed 50% proximal LAD 50% percent OM1, 60% proximal to distal LCx, 40% proximal RCA, 30% mid RCA, 50% RPDA.  RHC showed RA 4, RV 48/2, PA 48/19/33, PCWP 14, LVEDP 14, CI 3.2.  Echocardiogram 07/26/2022 (study read but report not transferring to epic) showed EF 25 to 30%, moderate to severe LV dilatation, normal RV function, severe left atrial enlargement, small  pericardial effusion, moderate mitral regurgitation.  Echocardiogram 07/30/2023 showed EF less than 20%, severe RV dysfunction, RVSP 52, severe biatrial enlargement, moderate left pleural effusion, RAP 8, suspected left atrial thrombus (declined Definity). -Continue losartan 25 mg daily.   -Unable to tolerate low-dose Toprol-XL due to fatigue. -Reported intolerance to Farxiga 10 mg daily previously but now reports she is tolerating. She does have a history of UTIs, discussed to let us know if having issues with UTIs on Farxiga -Check BMET, can add spironolactone if stable renal function/potassium -Had been on Lasix 20 mg daily as needed if gains more than 3 pounds in 1 day or 5 pounds in 1 week, or if worsening lower extremity edema.  She has noted worsening edema but has not been taking Lasix.  Volume overloaded on exam clinic today, recommend taking Lasix 20 mg daily -Recommend cardiac MRI to work-up nonischemic cardiomyopathy but she declines at this time -Given her EF remains less than 35% on echo 07/26/2022, discussed that she meets criteria for ICD but she has declined -Poor prognosis in setting of severe heart failure and comorbidities.  Has been difficult to get on GDMT due to medication intolerance as well as her renal dysfunction.  Discussed palliative care referral.  She declines and states she is now motivated to take her medications.  Will refer to advanced heart failure  Suspected LV thrombus: Echocardiogram 07/30/2023 with suspected LV apical thrombus.  She declined Definity.  Discussed this, she is agreeable to repeating echo with Definity.  Recommend starting Eliquis in the meantime, she is agreeable  Mitral regurgitation: Severe on echo 09/20/2021.  Appears functional.  Repeat echo 07/26/2022 shows moderate mitral regurgitation.  Remains moderate on echocardiogram 07/2023  Nonobstructive CAD: Cardiac catheterization on 10/09/2021, showed 50% proximal LAD 50% percent OM1, 60% proximal to  distal LCx, 40% proximal RCA, 30% mid RCA, 50% RPDA -Discontinue aspirin since starting Eliquis as above -Intolerance to statin.  Referred to pharmacy lipid clinic for PCSK9 inhibitor, but patient declines.  Cholesterol has improved significantly, LDL 51 on 03/06/2023  CKD stage IV: Creatinine 1.71 on 05/28/2023.  Check BMET  Hyperlipidemia: LDL has been as high as 180.  Unable to tolerate statins.  Referred to pharmacy for PCSK9 inhibitor but patient declined.  Cholesterol has improved significantly, LDL 51 on 03/06/2023  Carotid stenosis: Carotid duplex 08/2015 showed 1 to 39% bilateral internal carotid artery stenosis.  Carotid duplex 01/25/2022 showed discordant results: Peak velocities suggesting no significant stenosis, with ICA/CCA ratio suggesting 50 to 69% stenosis.  Will monitor   RTC in 1 month    Medication Adjustments/Labs and Tests Ordered: Current medicines are reviewed at length with the patient today.  Concerns regarding medicines are outlined above.  Orders Placed This Encounter  Procedures   Comprehensive Metabolic Panel (CMET)   B Nat Peptide   Magnesium   AMB referral to CHF clinic   ECHOCARDIOGRAM LIMITED    Meds ordered this encounter  Medications   furosemide (LASIX) 20 MG tablet    Sig: Take 1 tablet (20 mg total) by mouth daily.    Dispense:  90 tablet    Refill:  3     Patient Instructions  Medication Instructions:  Start Lasix 20 mg daily. Please take Potassium solution with Lasix 20 mg tablet  Continue all current medications *If you need a refill on your cardiac medications before your next appointment, please call your pharmacy*   Lab Work: CMET, Mg, BNP If you have labs (blood work) drawn today and your tests are completely normal, you will receive your results only by: MyChart Message (if you have MyChart) OR A paper copy in the mail If you have any lab test that is abnormal or we need to change your treatment, we will call you to review the  results.   Testing/Procedures: ECHO  Your physician has requested that you have an echocardiogram. Echocardiography is a painless test that uses sound waves to create images of your heart. It provides your doctor with information about the size and shape of your heart and how well your heart's chambers and valves are working. This procedure takes approximately one hour. There are no restrictions for this procedure. Please do NOT wear cologne, perfume, aftershave, or lotions (deodorant is allowed). Please arrive 15 minutes prior to your appointment time.    Follow-Up: At [facility], you and your health needs are our priority.  As part of our continuing mission to provide you with exceptional heart care, we have created designated Provider Care Teams.  These Care Teams include your primary Cardiologist (physician) and Advanced Practice Providers (APPs -  Physician Assistants and Nurse Practitioners) who all work together to provide you with the care you need, when you need it.   Your next appointment:    September 04, 2023 @4 :30 pm  Provider:   Dr. Bjorn Pippin   Other: Referral to Heart Failure Clinic      Signed, Little Ishikawa, MD  08/09/2023 6:22 AM    [facility]

## 2023-08-08 ENCOUNTER — Ambulatory Visit: Payer: Medicare Other | Attending: Cardiology | Admitting: Cardiology

## 2023-08-08 ENCOUNTER — Encounter: Payer: Self-pay | Admitting: Cardiology

## 2023-08-08 VITALS — BP 112/62 | HR 80 | Ht 64.0 in | Wt 122.0 lb

## 2023-08-08 DIAGNOSIS — I513 Intracardiac thrombosis, not elsewhere classified: Secondary | ICD-10-CM | POA: Diagnosis not present

## 2023-08-08 DIAGNOSIS — N184 Chronic kidney disease, stage 4 (severe): Secondary | ICD-10-CM

## 2023-08-08 DIAGNOSIS — I5022 Chronic systolic (congestive) heart failure: Secondary | ICD-10-CM | POA: Diagnosis not present

## 2023-08-08 DIAGNOSIS — I5042 Chronic combined systolic (congestive) and diastolic (congestive) heart failure: Secondary | ICD-10-CM

## 2023-08-08 DIAGNOSIS — I10 Essential (primary) hypertension: Secondary | ICD-10-CM

## 2023-08-08 DIAGNOSIS — E785 Hyperlipidemia, unspecified: Secondary | ICD-10-CM

## 2023-08-08 MED ORDER — FUROSEMIDE 20 MG PO TABS: 20.0000 mg | ORAL_TABLET | Freq: Every day | ORAL | 3 refills | Status: AC

## 2023-08-08 NOTE — Patient Instructions (Addendum)
Medication Instructions:  Start Lasix 20 mg daily. Please take Potassium solution with Lasix 20 mg tablet  Continue all current medications *If you need a refill on your cardiac medications before your next appointment, please call your pharmacy*   Lab Work: CMET, Mg, BNP If you have labs (blood work) drawn today and your tests are completely normal, you will receive your results only by: MyChart Message (if you have MyChart) OR A paper copy in the mail If you have any lab test that is abnormal or we need to change your treatment, we will call you to review the results.   Testing/Procedures: ECHO  Your physician has requested that you have an echocardiogram. Echocardiography is a painless test that uses sound waves to create images of your heart. It provides your doctor with information about the size and shape of your heart and how well your heart's chambers and valves are working. This procedure takes approximately one hour. There are no restrictions for this procedure. Please do NOT wear cologne, perfume, aftershave, or lotions (deodorant is allowed). Please arrive 15 minutes prior to your appointment time.    Follow-Up: At [facility], you and your health needs are our priority.  As part of our continuing mission to provide you with exceptional heart care, we have created designated Provider Care Teams.  These Care Teams include your primary Cardiologist (physician) and Advanced Practice Providers (APPs -  Physician Assistants and Nurse Practitioners) who all work together to provide you with the care you need, when you need it.   Your next appointment:    September 04, 2023 @4 :30 pm  Provider:   Dr. Bjorn Pippin   Other: Referral to Heart Failure Clinic

## 2023-08-09 ENCOUNTER — Telehealth: Payer: Self-pay

## 2023-08-09 LAB — COMPREHENSIVE METABOLIC PANEL
ALT: 5 [IU]/L (ref 0–32)
AST: 20 [IU]/L (ref 0–40)
Albumin: 3.9 g/dL (ref 3.8–4.8)
Alkaline Phosphatase: 136 [IU]/L — ABNORMAL HIGH (ref 44–121)
BUN/Creatinine Ratio: 11 — ABNORMAL LOW (ref 12–28)
BUN: 15 mg/dL (ref 8–27)
Bilirubin Total: 1.3 mg/dL — ABNORMAL HIGH (ref 0.0–1.2)
CO2: 26 mmol/L (ref 20–29)
Calcium: 9.7 mg/dL (ref 8.7–10.3)
Chloride: 98 mmol/L (ref 96–106)
Creatinine, Ser: 1.42 mg/dL — ABNORMAL HIGH (ref 0.57–1.00)
Globulin, Total: 2.8 g/dL (ref 1.5–4.5)
Glucose: 87 mg/dL (ref 70–99)
Potassium: 4 mmol/L (ref 3.5–5.2)
Sodium: 137 mmol/L (ref 134–144)
Total Protein: 6.7 g/dL (ref 6.0–8.5)
eGFR: 38 mL/min/{1.73_m2} — ABNORMAL LOW (ref >59)

## 2023-08-09 LAB — MAGNESIUM: Magnesium: 1.9 mg/dL (ref 1.6–2.3)

## 2023-08-09 LAB — BRAIN NATRIURETIC PEPTIDE: BNP: 4554.5 pg/mL — ABNORMAL HIGH (ref 0.0–100.0)

## 2023-08-09 NOTE — Telephone Encounter (Signed)
Spoke to patient Dr.Schumann advised to keep appointment with heart failure clinic 11/8 at 2:20 pm.Advised does not need appointment with Dr.Schumann 11/13 appointment cancelled.Stated she did have lab done yesterday.Advised I will call with results after Dr.Schumann reviews.

## 2023-08-27 ENCOUNTER — Ambulatory Visit: Payer: Medicare Other | Attending: Cardiology

## 2023-08-27 DIAGNOSIS — I10 Essential (primary) hypertension: Secondary | ICD-10-CM | POA: Diagnosis not present

## 2023-08-27 DIAGNOSIS — I5042 Chronic combined systolic (congestive) and diastolic (congestive) heart failure: Secondary | ICD-10-CM | POA: Diagnosis not present

## 2023-08-27 DIAGNOSIS — E785 Hyperlipidemia, unspecified: Secondary | ICD-10-CM

## 2023-08-27 LAB — ECHOCARDIOGRAM LIMITED
Area-P 1/2: 5.13 cm2
Calc EF: 12.7 %
Est EF: <20
MV M vel: 5.15 m/s
MV Peak grad: 106.1 mm[Hg]
S' Lateral: 6 cm
Single Plane A2C EF: 8.9 %
Single Plane A4C EF: 21.1 %

## 2023-08-27 MED ORDER — PERFLUTREN LIPID MICROSPHERE
1.0000 mL | INTRAVENOUS | Status: AC | PRN
Start: 2023-08-27 — End: 2023-08-27
  Administered 2023-08-27: 4 mL via INTRAVENOUS

## 2023-08-30 ENCOUNTER — Encounter (HOSPITAL_COMMUNITY): Payer: Self-pay | Admitting: Cardiology

## 2023-08-30 ENCOUNTER — Ambulatory Visit (HOSPITAL_COMMUNITY)
Admission: RE | Admit: 2023-08-30 | Discharge: 2023-08-30 | Disposition: A | Discharge status: Home / Self Care | Payer: Medicare Other | Source: Ambulatory Visit | Attending: Cardiology | Admitting: Cardiology

## 2023-08-30 VITALS — BP 106/60 | HR 90 | Wt 99.2 lb

## 2023-08-30 DIAGNOSIS — E785 Hyperlipidemia, unspecified: Secondary | ICD-10-CM | POA: Insufficient documentation

## 2023-08-30 DIAGNOSIS — I5042 Chronic combined systolic (congestive) and diastolic (congestive) heart failure: Secondary | ICD-10-CM

## 2023-08-30 DIAGNOSIS — I5022 Chronic systolic (congestive) heart failure: Secondary | ICD-10-CM | POA: Diagnosis not present

## 2023-08-30 DIAGNOSIS — I429 Cardiomyopathy, unspecified: Secondary | ICD-10-CM | POA: Diagnosis not present

## 2023-08-30 DIAGNOSIS — I340 Nonrheumatic mitral (valve) insufficiency: Secondary | ICD-10-CM | POA: Diagnosis not present

## 2023-08-30 DIAGNOSIS — I110 Hypertensive heart disease with heart failure: Secondary | ICD-10-CM | POA: Diagnosis not present

## 2023-08-30 NOTE — Progress Notes (Signed)
   ADVANCED HEART FAILURE NEW PATIENT CLINIC NOTE  Referring Physician: Little Ishikawa*  Primary Care: Little Ishikawa, MD Primary Cardiologist:  HPI: [name] is a 77 y.o. female with a PMH of *** who presents for initial visit for further evaluation and treatment of heart failure/cardiomyopathy.      She was initially seen on 09/08/2021 in gen cards clinic.  She reports she has been having dyspnea for last several months.  States that she is feels short of breath with minimal exertion.  Reports had episode of what she thinks was gastritis in July 2022, which he describes as pain in her upper abdomen and chest, but no chest pain since that time.   Echocardiogram on 09/20/2021 showed EF 20 to 25%, mild LV dilatation, moderate RV dysfunction, RVSP 61 mmHg, severe left atrial enlargement, small pericardial effusion, severe MR, mild AI.  Labs on 09/12/2021 showed creatinine 1.77 (increased from 1.55 in August), BNP 2321, hemoglobin 10.3.  Given her severe systolic heart failure and need for diuresis and tenuous kidney function, recommended admission.  She declined.  Cardiac catheterization was done as an outpatient on 10/09/2021, showed 50% proximal LAD 50% percent OM1, 60% proximal to distal LCx, 40% proximal RCA, 30% mid RCA, 50% RPDA.  RHC showed RA 4, RV 48/2, PA 48/19/33, PCWP 14, LVEDP 14, CI 3.2.   Echocardiogram 07/30/2023 showed EF less than 20%, severe RV dysfunction, RVSP 52, severe biatrial enlargement, moderate left pleural effusion, RAP 8, suspected left atrial thrombus (declined Definity).      SUBJECTIVE:   PMH, current medications, allergies, social history, and family history reviewed in epic.  PHYSICAL EXAM: There were no vitals filed for this visit. GENERAL: Well nourished and in no apparent distress at rest.  HEENT: The mucous membranes are pink and moist.   PULM: There are no chest wall deformities. There is no chest wall tenderness. Normal work of  breathing, clear to auscultation bilaterally. Respirations are unlabored.  CARDIAC:  JVP: ***         Normal rate with regular rhythm. No murmurs, rubs or gallops.  *** edema.  ABDOMEN: Soft, non-tender, non-distended. NEUROLOGIC: Patient is oriented x3 with no focal or lateralizing neurologic deficits.  PSYCH: Patients affect is appropriate, there is no evidence of anxiety or depression.  SKIN: Warm and dry; no lesions or wounds. Warm and well perfused extremities.  DATA REVIEW  ECG: ***    ECHO: ***  CATH: ***    Heart failure review: - Classification: {HFCLASS:30917} - Etiology: {Cardiomyopathy:30918} - NYHA Class:  - Volume status: {volumechf:30919} - ACEi/ARB/ARNI: {HF:30752} - Aldosterone antagonist: {HF:30752} - Beta-blocker: {HF:30752} - Digoxin: {HF:30752} - Hydralazine/Nitrates: {HF:30752} - SGLT2i: {HF:30752} - GLP-1: {GLP:30906} - Advanced therapies: {Advancedtherapies:30916} - ICD: {ICD:30901}   ASSESSMENT & PLAN:  ***    Clearnce Hasten, MD Advanced Heart Failure Mechanical Circulatory Support 08/30/23

## 2023-08-30 NOTE — Patient Instructions (Signed)
Medication Changes: No Changes In Medications at this time.   Follow-Up in: AS NEEDED   At the [facility], you and your health needs are our priority. We have a designated team specialized in the treatment of Heart Failure. This Care Team includes your primary Heart Failure Specialized Cardiologist (physician), Advanced Practice Providers (APPs- Physician Assistants and Nurse Practitioners), and Pharmacist who all work together to provide you with the care you need, when you need it.   You may see any of the following providers on your designated Care Team at your next follow up:  Dr. Arvilla Meres Dr. Marca Ancona Dr. Dorthula Nettles Dr. Theresia Bough Tonye Becket, NP Robbie Lis, [place] [facility] Amery, [place] Brynda Peon, NP [place] Lee, NP Karle Plumber, PharmD   Please be sure to bring in all your medications bottles to every appointment.   Need to Contact us:  If you have any questions or concerns before your next appointment please send [place] a message through [place] or call our office at [phone].    TO LEAVE A MESSAGE FOR THE NURSE SELECT OPTION 2, PLEASE LEAVE A MESSAGE INCLUDING: YOUR NAME DATE OF BIRTH CALL BACK NUMBER REASON FOR CALL**this is important as we prioritize the call backs  YOU WILL RECEIVE A CALL BACK THE SAME DAY AS LONG AS YOU CALL BEFORE 4:00 PM

## 2023-09-01 ENCOUNTER — Encounter: Payer: Self-pay | Admitting: Cardiology

## 2023-09-02 ENCOUNTER — Other Ambulatory Visit: Payer: Self-pay

## 2023-09-02 MED ORDER — LOSARTAN POTASSIUM 25 MG PO TABS: 25.0000 mg | ORAL_TABLET | Freq: Every day | ORAL | 3 refills | Status: DC

## 2023-09-03 NOTE — Telephone Encounter (Signed)
Yes we can refill losartan.  That is fine to take aspirin every other day if she needs to for nosebleeds

## 2023-09-04 ENCOUNTER — Ambulatory Visit: Payer: Medicare Other | Admitting: Cardiology

## 2023-09-06 ENCOUNTER — Other Ambulatory Visit (HOSPITAL_COMMUNITY): Payer: Medicare Other

## 2023-10-15 ENCOUNTER — Telehealth: Payer: Self-pay | Admitting: Cardiology

## 2023-10-15 NOTE — Telephone Encounter (Signed)
Patient identification verified by 2 forms. Marilynn Rail, RN    Called and spoke to patient  Patient states:   -her insurance is denying coverage for the Injection that was given during Echo   -Echo was completed on 11/05  -she does not know how much she will be charged   -she received a letter notice from insurance, in the notice it stated she had the right to appeal it   -she has not contacted insurance regarding appeal  Informed patient message sent for assistance  Advised patient to also contact insurance  Patient verbalized understanding, no questions at this time

## 2023-10-15 NOTE — Telephone Encounter (Signed)
Patient stated she wants her doctor to submit an appeal to her insurance company to allow her to have the echocardiogram test with injection.

## 2024-01-05 NOTE — Progress Notes (Unsigned)
 Cardiology Office Note:    Date:  01/08/2024   ID:  Caitlin, Powell [birth year], MRN [id number]  PCP:  Little Ishikawa, MD  Cardiologist:  None  Electrophysiologist:  None   Referring MD: Little Ishikawa*   Chief Complaint  Patient presents with   Congestive Heart Failure    History of Present Illness:    [name] is a 78 y.o. female with a hx of hypertension, nonobstructive CAD, hyperlipidemia who presents for follow-up.  She was initially seen on 09/08/2021.  She reports she has been having dyspnea for last several months.  States that she is feels short of breath with minimal exertion.  Reports had episode of what she thinks was gastritis in July 2022, which he describes as pain in her upper abdomen and chest, but no chest pain since that time.  She denies any lightheadedness, syncope, or palpitations.  Has noted swelling in her left leg.  She smoked for over 40 years, quit in 2009.  Echocardiogram 06/27/2012 showed EF 65 to 70%, normal RV function, no significant valvular disease.  Carotid duplex 08/2015 showed 1 to 39% bilateral internal carotid artery stenosis.  Echocardiogram on 09/20/2021 showed EF 20 to 25%, mild LV dilatation, moderate RV dysfunction, RVSP 61 mmHg, severe left atrial enlargement, small pericardial effusion, severe MR, mild AI.  Labs on 09/12/2021 showed creatinine 1.77 (increased from 1.55 in August), BNP 2321, hemoglobin 10.3.  Given her severe systolic heart failure and need for diuresis and tenuous kidney function, recommended admission.  She declined.  Cardiac catheterization was done as an outpatient on 10/09/2021, showed 50% proximal LAD 50% percent OM1, 60% proximal to distal LCx, 40% proximal RCA, 30% mid RCA, 50% RPDA.  RHC showed RA 4, RV 48/2, PA 48/19/33, PCWP 14, LVEDP 14, CI 3.2.  Echocardiogram 07/26/2022 (study read but report not transferring to epic) showed EF 25 to 30%, moderate to severe LV dilatation, normal RV function, severe  left atrial enlargement, small pericardial effusion, moderate mitral regurgitation.  Echocardiogram 07/30/2023 showed EF less than 20%, severe RV dysfunction, RVSP 52, severe biatrial enlargement, moderate left pleural effusion, RAP 8, suspected left atrial thrombus (declined Definity).  Repeat echo with Definity 08/2023 showed no LV thrombus.  Since last clinic visit, she reports she is doing okay.  States that lower extremity edema has resolved.  States that she has not needed to take Lasix over the last several months.  She reports compliance with losartan and [place].  She is taking aspirin every other day.  Denies any chest pain, dyspnea, lightheadedness, syncope, lower extremity edema, or palpitations.  Main complaint is feeling fatigued, reports she takes several naps per day.   Wt Readings from Last 3 Encounters:  01/06/24 116 lb 3.2 oz (52.7 kg)  08/30/23 99 lb 3.2 oz (45 kg)  08/08/23 122 lb (55.3 kg)     Past Medical History:  Diagnosis Date   Cataract of right eye    Essential hypertension, benign    History of cardiac catheterization    Reportedly no significant CAD 1998 - previously followed with Dr. Corinda Gubler   Mixed hyperlipidemia    Osteoporosis    Recurrent UTI     Past Surgical History:  Procedure Laterality Date   ABDOMINAL HYSTERECTOMY     CATARACT EXTRACTION W/PHACO  09/17/2011   Procedure: CATARACT EXTRACTION PHACO AND INTRAOCULAR LENS PLACEMENT (IOC);  Surgeon: Gemma Payor;  Location: [facility];  Service: Ophthalmology;  Laterality: Right;  CDE:23.52   CYSTOSCOPY  FOOT SURGERY     LIPOMA EXCISION     Back   RIGHT/LEFT HEART CATH AND CORONARY ANGIOGRAPHY N/A 10/09/2021   Procedure: RIGHT/LEFT HEART CATH AND CORONARY ANGIOGRAPHY;  Surgeon: Kathleene Hazel, MD;  Location: [facility];  Service: Cardiovascular;  Laterality: N/A;    Current Medications: Current Meds  Medication Sig   aspirin EC 81 MG tablet Take 81 mg by mouth every other day.  Swallow whole.   dapagliflozin propanediol (FARXIGA) 10 MG TABS tablet Take 1 tablet (10 mg total) by mouth daily before breakfast.   losartan (COZAAR) 25 MG tablet Take 1 tablet (25 mg total) by mouth daily.   Potassium Chloride 40 MEQ/15ML (20%) SOLN Take 3.5 mLs by mouth as needed (with lasix).     Allergies:   Cephalexin, Egg-derived products, Milk (cow), Milk-related compounds, and Sulfa antibiotics   Social History   Socioeconomic History   Marital status: Divorced    Spouse name: Not on file   Number of children: Not on file   Years of education: Not on file   Highest education level: Not on file  Occupational History   Not on file  Tobacco Use   Smoking status: Former    Types: Cigarettes    Passive exposure: Never   Smokeless tobacco: Never  Vaping Use   Vaping status: Never Used  Substance and Sexual Activity   Alcohol use: No   Drug use: No   Sexual activity: Not on file  Other Topics Concern   Not on file  Social History Narrative   Not on file   Social Drivers of Health   Financial Resource Strain: Not on file  Food Insecurity: Not on file  Transportation Needs: Not on file  Physical Activity: Not on file  Stress: Not on file  Social Connections: Not on file     Family History: The patient's family history includes Cancer in her father and mother; Diabetes in her brother; Hypertension in her brother and mother; Stroke in her brother and mother.  ROS:   Please see the history of present illness.     All other systems reviewed and are negative.  EKGs/Labs/Other Studies Reviewed:    The following studies were reviewed today:   EKG:   04/25/2022: Sinus rhythm, first-degree AV block, rate 86, poor R wave progression, left axis deviation 02/01/23: Sinus tachycardia, rate 102, right axis deviation, poor R wave progression 05/14/23: Sinus tachycardia, rate 106, PVC, poor R wave progression  Recent Labs: 02/01/2023: Hemoglobin 12.9; Platelets  185 08/08/2023: ALT 5; BNP 4,554.5; BUN 15; Creatinine, Ser 1.42; Magnesium 1.9; Potassium 4.0; Sodium 137  Recent Lipid Panel    Component Value Date/Time   CHOL 173 09/12/2021 1128   TRIG 78 09/12/2021 1128   HDL 58 09/12/2021 1128   CHOLHDL 3.0 09/12/2021 1128   VLDL 16 09/12/2021 1128   LDLCALC 99 09/12/2021 1128    Physical Exam:    VS:  BP (!) 110/58 (BP Location: Left Arm, Patient Position: Sitting, Cuff Size: Normal)   Pulse 98   Ht 5\' 4"  (1.626 m)   Wt 116 lb 3.2 oz (52.7 kg)   SpO2 93%   BMI 19.95 kg/m     Wt Readings from Last 3 Encounters:  01/06/24 116 lb 3.2 oz (52.7 kg)  08/30/23 99 lb 3.2 oz (45 kg)  08/08/23 122 lb (55.3 kg)     GEN:  in no acute distress HEENT: Normal NECK: No JVD CARDIAC: RRR, no  murmurs, rubs, gallops RESPIRATORY: CTAB  ABDOMEN: Soft, non-tender, non-distended MUSCULOSKELETAL:  Trace edema NEUROLOGIC:  Alert and oriented x 3 PSYCHIATRIC:  Normal affect   ASSESSMENT:    1. Chronic combined systolic and diastolic heart failure (HCC)   2. Hyperlipidemia, unspecified hyperlipidemia type   3. Essential hypertension, benign   4. Chronic kidney disease (CKD), stage IV (severe) (HCC)       PLAN:    Chronic combined systolic and diastolic heart failure: Echocardiogram on 09/20/2021 showed EF 20 to 25%, mild LV dilatation, moderate RV dysfunction, RVSP 61 mmHg, severe left atrial enlargement, small pericardial effusion, severe MR, mild AI.  Cardiac catheterization on 10/09/2021, showed 50% proximal LAD 50% percent OM1, 60% proximal to distal LCx, 40% proximal RCA, 30% mid RCA, 50% RPDA.  RHC showed RA 4, RV 48/2, PA 48/19/33, PCWP 14, LVEDP 14, CI 3.2.  Echocardiogram 07/26/2022 (study read but report not transferring to epic) showed EF 25 to 30%, moderate to severe LV dilatation, normal RV function, severe left atrial enlargement, small pericardial effusion, moderate mitral regurgitation.  Echocardiogram 07/30/2023 showed EF less than 20%,  severe RV dysfunction, RVSP 52, severe biatrial enlargement, moderate left pleural effusion, RAP 8, suspected left atrial thrombus (declined Definity). -Continue losartan 25 mg daily.   -Unable to tolerate low-dose Toprol-XL due to fatigue. -Reported intolerance to Farxiga 10 mg daily previously but now reports she is tolerating. She does have a history of UTIs, discussed to let us know if having issues with UTIs on Farxiga -Check BMET -Continue Lasix 20 mg daily as needed -Recommend cardiac MRI to work-up nonischemic cardiomyopathy but she declines at this time -Given her EF remains less than 35% on echo 07/26/2022, discussed that she meets criteria for ICD but she has declined -Poor prognosis in setting of severe heart failure and comorbidities.  Has been difficult to get on GDMT due to medication intolerance as well as her renal dysfunction.  Referred to advanced heart failure, recommended palliative care referral.  She previously had declined palliative care but is now agreeable to referral.  Suspected LV thrombus: Echocardiogram 07/30/2023 with suspected LV apical thrombus.  She declined Definity.  Discussed this, she is agreeable to repeating echo with Definity.  Repeat echo with Definity 08/27/2023 showed no LV thrombus.  Eliquis discontinued.  Mitral regurgitation: Severe on echo 09/20/2021.  Appears functional.  Repeat echo 07/26/2022 shows moderate mitral regurgitation.  Remains moderate on echocardiogram 07/2023  Nonobstructive CAD: Cardiac catheterization on 10/09/2021, showed 50% proximal LAD 50% percent OM1, 60% proximal to distal LCx, 40% proximal RCA, 30% mid RCA, 50% RPDA -Discontinue aspirin since starting Eliquis as above -Intolerance to statin.  Referred to pharmacy lipid clinic for PCSK9 inhibitor, but patient declines.  Cholesterol has improved significantly, LDL 51 on 03/06/2023  CKD stage IV: Creatinine 1.71 on 05/28/2023.  Check BMET  Hyperlipidemia: LDL has been as high as 180.   Unable to tolerate statins.  Referred to pharmacy for PCSK9 inhibitor but patient declined.  Cholesterol has improved significantly, LDL 51 on 03/06/2023  Carotid stenosis: Carotid duplex 08/2015 showed 1 to 39% bilateral internal carotid artery stenosis.  Carotid duplex 01/25/2022 showed discordant results: Peak velocities suggesting no significant stenosis, with ICA/CCA ratio suggesting 50 to 69% stenosis.  Will monitor   RTC in 6 months    Medication Adjustments/Labs and Tests Ordered: Current medicines are reviewed at length with the patient today.  Concerns regarding medicines are outlined above.  Orders Placed This Encounter  Procedures   Basic  Metabolic Panel (BMET)   Magnesium   Amb Referral to Palliative Care    No orders of the defined types were placed in this encounter.    Patient Instructions  Medication Instructions:  Your physician recommends that you continue on your current medications as directed. Please refer to the Current Medication list given to you today.  *If you need a refill on your cardiac medications before your next appointment, please call your pharmacy*   Lab Work: BMET, Magnesium If you have labs (blood work) drawn today and your tests are completely normal, you will receive your results only by: MyChart Message (if you have MyChart) OR A paper copy in the mail If you have any lab test that is abnormal or we need to change your treatment, we will call you to review the results.   Follow-Up: At [facility], you and your health needs are our priority.  As part of our continuing mission to provide you with exceptional heart care, we have created designated Provider Care Teams.  These Care Teams include your primary Cardiologist (physician) and Advanced Practice Providers (APPs -  Physician Assistants and Nurse Practitioners) who all work together to provide you with the care you need, when you need it.   Your next appointment:   6  month(s)  Provider:   Dr. Bjorn Pippin  Other Instructions     Signed, Little Ishikawa, MD  01/08/2024 9:34 AM    [facility]

## 2024-01-06 ENCOUNTER — Ambulatory Visit: Payer: Medicare Other | Attending: Cardiology | Admitting: Cardiology

## 2024-01-06 ENCOUNTER — Encounter: Payer: Self-pay | Admitting: Cardiology

## 2024-01-06 VITALS — BP 110/58 | HR 98 | Ht 64.0 in | Wt 116.2 lb

## 2024-01-06 DIAGNOSIS — E785 Hyperlipidemia, unspecified: Secondary | ICD-10-CM

## 2024-01-06 DIAGNOSIS — I5042 Chronic combined systolic (congestive) and diastolic (congestive) heart failure: Secondary | ICD-10-CM

## 2024-01-06 DIAGNOSIS — N1832 Chronic kidney disease, stage 3b: Secondary | ICD-10-CM

## 2024-01-06 DIAGNOSIS — I10 Essential (primary) hypertension: Secondary | ICD-10-CM | POA: Diagnosis not present

## 2024-01-06 DIAGNOSIS — N184 Chronic kidney disease, stage 4 (severe): Secondary | ICD-10-CM

## 2024-01-06 NOTE — Patient Instructions (Addendum)
 Medication Instructions:  Your physician recommends that you continue on your current medications as directed. Please refer to the Current Medication list given to you today.  *If you need a refill on your cardiac medications before your next appointment, please call your pharmacy*   Lab Work: BMET, Magnesium If you have labs (blood work) drawn today and your tests are completely normal, you will receive your results only by: MyChart Message (if you have MyChart) OR A paper copy in the mail If you have any lab test that is abnormal or we need to change your treatment, we will call you to review the results.   Follow-Up: At [facility], you and your health needs are our priority.  As part of our continuing mission to provide you with exceptional heart care, we have created designated Provider Care Teams.  These Care Teams include your primary Cardiologist (physician) and Advanced Practice Providers (APPs -  Physician Assistants and Nurse Practitioners) who all work together to provide you with the care you need, when you need it.   Your next appointment:   6 month(s)  Provider:   Dr. Bjorn Pippin  Other Instructions

## 2024-01-09 ENCOUNTER — Other Ambulatory Visit (HOSPITAL_COMMUNITY)
Admission: RE | Admit: 2024-01-09 | Discharge: 2024-01-09 | Disposition: A | Discharge status: Home / Self Care | Source: Ambulatory Visit | Attending: Cardiology | Admitting: Cardiology

## 2024-01-09 DIAGNOSIS — Z79899 Other long term (current) drug therapy: Secondary | ICD-10-CM | POA: Insufficient documentation

## 2024-01-09 DIAGNOSIS — N1832 Chronic kidney disease, stage 3b: Secondary | ICD-10-CM | POA: Insufficient documentation

## 2024-01-09 DIAGNOSIS — I5042 Chronic combined systolic (congestive) and diastolic (congestive) heart failure: Secondary | ICD-10-CM | POA: Insufficient documentation

## 2024-01-09 LAB — BASIC METABOLIC PANEL
Anion gap: 10 (ref 5–15)
BUN: 24 mg/dL — ABNORMAL HIGH (ref 8–23)
CO2: 26 mmol/L (ref 22–32)
Calcium: 9.5 mg/dL (ref 8.9–10.3)
Chloride: 102 mmol/L (ref 98–111)
Creatinine, Ser: 1.78 mg/dL — ABNORMAL HIGH (ref 0.44–1.00)
GFR, Estimated: 29 mL/min — ABNORMAL LOW (ref >60)
Glucose, Bld: 94 mg/dL (ref 70–99)
Potassium: 3.9 mmol/L (ref 3.5–5.1)
Sodium: 138 mmol/L (ref 135–145)

## 2024-01-10 ENCOUNTER — Encounter: Payer: Self-pay | Admitting: *Deleted

## 2024-01-14 ENCOUNTER — Telehealth: Payer: Self-pay | Admitting: Cardiology

## 2024-01-14 MED ORDER — DAPAGLIFLOZIN PROPANEDIOL 10 MG PO TABS: 10.0000 mg | ORAL_TABLET | Freq: Every day | ORAL | 3 refills | Status: DC

## 2024-01-14 NOTE — Telephone Encounter (Signed)
*  STAT* If patient is at the pharmacy, call can be transferred to refill team.   1. Which medications need to be refilled? (please list name of each medication and dose if known)  dapagliflozin propanediol (FARXIGA) 10 MG TABS tablet  2. Which pharmacy/location (including street and city if local pharmacy) is medication to be sent to? [address] - [place], [place] - [address] Phone: [phone]  Fax: [phone]     3. Do they need a 30 day or 90 day supply? 90 pt is out of medication and pt wants it for 90 days

## 2024-03-16 ENCOUNTER — Encounter (HOSPITAL_BASED_OUTPATIENT_CLINIC_OR_DEPARTMENT_OTHER): Payer: Self-pay | Admitting: Cardiology

## 2024-03-17 ENCOUNTER — Other Ambulatory Visit: Payer: Self-pay | Admitting: *Deleted

## 2024-03-17 MED ORDER — DAPAGLIFLOZIN PROPANEDIOL 10 MG PO TABS: 10.0000 mg | ORAL_TABLET | Freq: Every day | ORAL | 3 refills | Status: AC

## 2024-03-17 MED ORDER — LOSARTAN POTASSIUM 25 MG PO TABS: 25.0000 mg | ORAL_TABLET | Freq: Every day | ORAL | 3 refills | Status: AC

## 2024-03-17 NOTE — Telephone Encounter (Signed)
 Routing back to correct pool

## 2024-03-17 NOTE — Progress Notes (Signed)
 Called and made patient aware that prescriptions for Losartan  and Farxiga  will be sent to [facility] per Dr. Alda Amas. Appt made for patient 6 month f/u.

## 2024-03-17 NOTE — Telephone Encounter (Signed)
Routing to correct triage pool

## 2024-04-21 DIAGNOSIS — N1330 Unspecified hydronephrosis: Secondary | ICD-10-CM | POA: Diagnosis not present

## 2024-04-21 DIAGNOSIS — N390 Urinary tract infection, site not specified: Secondary | ICD-10-CM | POA: Diagnosis not present

## 2024-04-21 DIAGNOSIS — N289 Disorder of kidney and ureter, unspecified: Secondary | ICD-10-CM | POA: Diagnosis not present

## 2024-04-21 DIAGNOSIS — Z09 Encounter for follow-up examination after completed treatment for conditions other than malignant neoplasm: Secondary | ICD-10-CM | POA: Diagnosis not present

## 2024-04-28 ENCOUNTER — Encounter: Payer: Self-pay | Admitting: Cardiology

## 2024-06-29 NOTE — Progress Notes (Unsigned)
 Cardiology Office Note:    Date:  06/29/2024   ID:  [name], DOB 11/21/1945, MRN [id number]  PCP:  No primary care provider on file.  Cardiologist:  None  Electrophysiologist:  None   Referring MD: Kate Lonni CROME*   No chief complaint on file.   History of Present Illness:    [name] is a 78 y.o. female with a hx of hypertension, nonobstructive CAD, hyperlipidemia who presents for follow-up.  She was initially seen on 09/08/2021.  She reports she has been having dyspnea for last several months.  States that she is feels short of breath with minimal exertion.  Reports had episode of what she thinks was gastritis in July 2022, which he describes as pain in her upper abdomen and chest, but no chest pain since that time.  She denies any lightheadedness, syncope, or palpitations.  Has noted swelling in her left leg.  She smoked for over 40 years, quit in 2009.  Echocardiogram 06/27/2012 showed EF 65 to 70%, normal RV function, no significant valvular disease.  Carotid duplex 08/2015 showed 1 to 39% bilateral internal carotid artery stenosis.  Echocardiogram on 09/20/2021 showed EF 20 to 25%, mild LV dilatation, moderate RV dysfunction, RVSP 61 mmHg, severe left atrial enlargement, small pericardial effusion, severe MR, mild AI.  Labs on 09/12/2021 showed creatinine 1.77 (increased from 1.55 in August), BNP 2321, hemoglobin 10.3.  Given her severe systolic heart failure and need for diuresis and tenuous kidney function, recommended admission.  She declined.  Cardiac catheterization was done as an outpatient on 10/09/2021, showed 50% proximal LAD 50% percent OM1, 60% proximal to distal LCx, 40% proximal RCA, 30% mid RCA, 50% RPDA.  RHC showed RA 4, RV 48/2, PA 48/19/33, PCWP 14, LVEDP 14, CI 3.2.  Echocardiogram 07/26/2022 (study read but report not transferring to epic) showed EF 25 to 30%, moderate to severe LV dilatation, normal RV function, severe left atrial enlargement, small  pericardial effusion, moderate mitral regurgitation.  Echocardiogram 07/30/2023 showed EF less than 20%, severe RV dysfunction, RVSP 52, severe biatrial enlargement, moderate left pleural effusion, RAP 8, suspected left atrial thrombus (declined Definity ).  Repeat echo with Definity  08/2023 showed no LV thrombus.  Since last clinic visit,  she reports she is doing okay.  States that lower extremity edema has resolved.  States that she has not needed to take Lasix  over the last several months.  She reports compliance with losartan  and Farxiga .  She is taking aspirin  every other day.  Denies any chest pain, dyspnea, lightheadedness, syncope, lower extremity edema, or palpitations.  Main complaint is feeling fatigued, reports she takes several naps per day.   Wt Readings from Last 3 Encounters:  01/06/24 116 lb 3.2 oz (52.7 kg)  08/30/23 99 lb 3.2 oz (45 kg)  08/08/23 122 lb (55.3 kg)     Past Medical History:  Diagnosis Date   Cataract of right eye    Essential hypertension, benign    History of cardiac catheterization    Reportedly no significant CAD 1998 - previously followed with Dr. Cloretta   Mixed hyperlipidemia    Osteoporosis    Recurrent UTI     Past Surgical History:  Procedure Laterality Date   ABDOMINAL HYSTERECTOMY     CATARACT EXTRACTION W/PHACO  09/17/2011   Procedure: CATARACT EXTRACTION PHACO AND INTRAOCULAR LENS PLACEMENT (IOC);  Surgeon: Cherene Mania;  Location: [facility];  Service: Ophthalmology;  Laterality: Right;  CDE:23.52   CYSTOSCOPY  FOOT SURGERY     LIPOMA EXCISION     Back   RIGHT/LEFT HEART CATH AND CORONARY ANGIOGRAPHY N/A 10/09/2021   Procedure: RIGHT/LEFT HEART CATH AND CORONARY ANGIOGRAPHY;  Surgeon: Verlin Lonni BIRCH, MD;  Location: [facility];  Service: Cardiovascular;  Laterality: N/A;    Current Medications: No outpatient medications have been marked as taking for the 07/02/24 encounter (Appointment) with Kate Lonni CROME, MD.      Allergies:   Cephalexin, Egg-derived products, Milk (cow), Milk-related compounds, and Sulfa antibiotics   Social History   Socioeconomic History   Marital status: Divorced    Spouse name: Not on file   Number of children: Not on file   Years of education: Not on file   Highest education level: Not on file  Occupational History   Not on file  Tobacco Use   Smoking status: Former    Types: Cigarettes    Passive exposure: Never   Smokeless tobacco: Never  Vaping Use   Vaping status: Never Used  Substance and Sexual Activity   Alcohol use: No   Drug use: No   Sexual activity: Not on file  Other Topics Concern   Not on file  Social History Narrative   Not on file   Social Drivers of Health   Financial Resource Strain: Not on file  Food Insecurity: Low Risk  (04/21/2024)   Received from [facility]   Hunger Vital Sign    Within the past 12 months, you worried that your food would run out before you got money to buy more: Never true    Within the past 12 months, the food you bought just didn't last and you didn't have money to get more. : Never true  Transportation Needs: No Transportation Needs (04/21/2024)   Received from [facility]    In the past 12 months, has lack of reliable transportation kept you from medical appointments, meetings, work or from getting things needed for daily living? : No  Physical Activity: Not on file  Stress: Not on file  Social Connections: Not on file     Family History: The patient's family history includes Cancer in her father and mother; Diabetes in her brother; Hypertension in her brother and mother; Stroke in her brother and mother.  ROS:   Please see the history of present illness.     All other systems reviewed and are negative.  EKGs/Labs/Other Studies Reviewed:    The following studies were reviewed today:   EKG:   04/25/2022: Sinus rhythm, first-degree AV block, rate 86, poor R wave progression, left  axis deviation 02/01/23: Sinus tachycardia, rate 102, right axis deviation, poor R wave progression 05/14/23: Sinus tachycardia, rate 106, PVC, poor R wave progression  Recent Labs: 08/08/2023: ALT 5; BNP 4,554.5; Magnesium 1.9 01/09/2024: BUN 24; Creatinine, Ser 1.78; Potassium 3.9; Sodium 138  Recent Lipid Panel    Component Value Date/Time   CHOL 173 09/12/2021 1128   TRIG 78 09/12/2021 1128   HDL 58 09/12/2021 1128   CHOLHDL 3.0 09/12/2021 1128   VLDL 16 09/12/2021 1128   LDLCALC 99 09/12/2021 1128    Physical Exam:    VS:  There were no vitals taken for this visit.    Wt Readings from Last 3 Encounters:  01/06/24 116 lb 3.2 oz (52.7 kg)  08/30/23 99 lb 3.2 oz (45 kg)  08/08/23 122 lb (55.3 kg)     GEN:  in no acute distress HEENT:  Normal NECK: No JVD CARDIAC: RRR, no murmurs, rubs, gallops RESPIRATORY: CTAB  ABDOMEN: Soft, non-tender, non-distended MUSCULOSKELETAL:  Trace edema NEUROLOGIC:  Alert and oriented x 3 PSYCHIATRIC:  Normal affect   ASSESSMENT:    No diagnosis found.     PLAN:    Chronic combined systolic and diastolic heart failure: Echocardiogram on 09/20/2021 showed EF 20 to 25%, mild LV dilatation, moderate RV dysfunction, RVSP 61 mmHg, severe left atrial enlargement, small pericardial effusion, severe MR, mild AI.  Cardiac catheterization on 10/09/2021, showed 50% proximal LAD 50% percent OM1, 60% proximal to distal LCx, 40% proximal RCA, 30% mid RCA, 50% RPDA.  RHC showed RA 4, RV 48/2, PA 48/19/33, PCWP 14, LVEDP 14, CI 3.2.  Echocardiogram 07/26/2022 (study read but report not transferring to epic) showed EF 25 to 30%, moderate to severe LV dilatation, normal RV function, severe left atrial enlargement, small pericardial effusion, moderate mitral regurgitation.  Echocardiogram 07/30/2023 showed EF less than 20%, severe RV dysfunction, RVSP 52, severe biatrial enlargement, moderate left pleural effusion, RAP 8, suspected left atrial thrombus (declined  Definity ). -Continue losartan  25 mg daily.   -Unable to tolerate low-dose Toprol -XL due to fatigue. -Reported intolerance to Farxiga  10 mg daily previously but now reports she is tolerating. She does have a history of UTIs, discussed to let us  know if having issues with UTIs on Farxiga  -Check BMET -Continue Lasix  20 mg daily as needed -Recommend cardiac MRI to work-up nonischemic cardiomyopathy but she declines at this time -Given her EF remains less than 35% on echo 07/26/2022, discussed that she meets criteria for ICD but she has declined -Poor prognosis in setting of severe heart failure and comorbidities.  Has been difficult to get on GDMT due to medication intolerance as well as her renal dysfunction.  Referred to advanced heart failure, recommended palliative care referral.  She previously had declined palliative care but is now agreeable to referral.***  Suspected LV thrombus: Echocardiogram 07/30/2023 with suspected LV apical thrombus.  She declined Definity .  Discussed this, she is agreeable to repeating echo with Definity .  Repeat echo with Definity  08/27/2023 showed no LV thrombus.  Eliquis  discontinued.  Mitral regurgitation: Severe on echo 09/20/2021.  Appears functional.  Repeat echo 07/26/2022 shows moderate mitral regurgitation.  Remains moderate on echocardiogram 07/2023  Nonobstructive CAD: Cardiac catheterization on 10/09/2021, showed 50% proximal LAD 50% percent OM1, 60% proximal to distal LCx, 40% proximal RCA, 30% mid RCA, 50% RPDA -Discontinue aspirin  since starting Eliquis  as above -Intolerance to statin.  Referred to pharmacy lipid clinic for PCSK9 inhibitor, but patient declines.  Cholesterol has improved significantly, LDL 51 on 03/06/2023  CKD stage IV: Creatinine 1.71 on 05/28/2023.  Check BMET***  Hyperlipidemia: LDL has been as high as 180.  Unable to tolerate statins.  Referred to pharmacy for PCSK9 inhibitor but patient declined.  Cholesterol has improved significantly,  LDL 51 on 03/06/2023  Carotid stenosis: Carotid duplex 08/2015 showed 1 to 39% bilateral internal carotid artery stenosis.  Carotid duplex 01/25/2022 showed discordant results: Peak velocities suggesting no significant stenosis, with ICA/CCA ratio suggesting 50 to 69% stenosis.  Will monitor   RTC in 6 months***    Medication Adjustments/Labs and Tests Ordered: Current medicines are reviewed at length with the patient today.  Concerns regarding medicines are outlined above.  No orders of the defined types were placed in this encounter.   No orders of the defined types were placed in this encounter.    There are no Patient Instructions on file  for this visit.   Signed, Lonni LITTIE Nanas, MD  06/29/2024 11:50 AM    [facility]

## 2024-07-02 ENCOUNTER — Encounter: Payer: Self-pay | Admitting: Cardiology

## 2024-07-02 ENCOUNTER — Ambulatory Visit: Attending: Cardiology | Admitting: Cardiology

## 2024-07-02 VITALS — BP 138/76 | HR 84 | Ht 64.0 in | Wt 127.8 lb

## 2024-07-02 DIAGNOSIS — N184 Chronic kidney disease, stage 4 (severe): Secondary | ICD-10-CM

## 2024-07-02 DIAGNOSIS — E785 Hyperlipidemia, unspecified: Secondary | ICD-10-CM | POA: Diagnosis not present

## 2024-07-02 DIAGNOSIS — I5042 Chronic combined systolic (congestive) and diastolic (congestive) heart failure: Secondary | ICD-10-CM | POA: Diagnosis not present

## 2024-07-02 DIAGNOSIS — Z79899 Other long term (current) drug therapy: Secondary | ICD-10-CM | POA: Diagnosis not present

## 2024-07-02 DIAGNOSIS — I2510 Atherosclerotic heart disease of native coronary artery without angina pectoris: Secondary | ICD-10-CM

## 2024-07-02 NOTE — Patient Instructions (Addendum)
 Medication Instructions:  Your physician recommends that you continue on your current medications as directed. Please refer to the Current Medication list given to you today.  *If you need a refill on your cardiac medications before your next appointment, please call your pharmacy*  Lab Work: CMET, Mag, Lipids, CBC If you have labs (blood work) drawn today and your tests are completely normal, you will receive your results only by: MyChart Message (if you have MyChart) OR A paper copy in the mail If you have any lab test that is abnormal or we need to change your treatment, we will call you to review the results.  Testing/Procedures: Your physician has requested that you have an echocardiogram in 6 months. Echocardiography is a painless test that uses sound waves to create images of your heart. It provides your doctor with information about the size and shape of your heart and how well your heart's chambers and valves are working. This procedure takes approximately one hour. There are no restrictions for this procedure. Please do NOT wear cologne, perfume, aftershave, or lotions (deodorant is allowed). Please arrive 15 minutes prior to your appointment time.  Please note: We ask at that you not bring children with you during ultrasound (echo/ vascular) testing. Due to room size and safety concerns, children are not allowed in the ultrasound rooms during exams. Our front office staff cannot provide observation of children in our lobby area while testing is being conducted. An adult accompanying a patient to their appointment will only be allowed in the ultrasound room at the discretion of the ultrasound technician under special circumstances. We apologize for any inconvenience.   Follow-Up: At [facility], you and your health needs are our priority.  As part of our continuing mission to provide you with exceptional heart care, our providers are all part of one team.  This team includes  your primary Cardiologist (physician) and Advanced Practice Providers or APPs (Physician Assistants and Nurse Practitioners) who all work together to provide you with the care you need, when you need it.  Your next appointment:   6 month(s) after echo  Provider:   Lonni Nanas, MD

## 2024-07-06 ENCOUNTER — Other Ambulatory Visit (HOSPITAL_COMMUNITY)
Admission: RE | Admit: 2024-07-06 | Discharge: 2024-07-06 | Disposition: A | Discharge status: Home / Self Care | Source: Ambulatory Visit | Attending: Cardiology | Admitting: Cardiology

## 2024-07-06 DIAGNOSIS — Z79899 Other long term (current) drug therapy: Secondary | ICD-10-CM | POA: Insufficient documentation

## 2024-07-06 DIAGNOSIS — I5042 Chronic combined systolic (congestive) and diastolic (congestive) heart failure: Secondary | ICD-10-CM | POA: Diagnosis not present

## 2024-07-06 DIAGNOSIS — E785 Hyperlipidemia, unspecified: Secondary | ICD-10-CM | POA: Insufficient documentation

## 2024-07-06 LAB — COMPREHENSIVE METABOLIC PANEL WITH GFR
ALT: 18 U/L (ref 0–44)
AST: 30 U/L (ref 15–41)
Albumin: 3.5 g/dL (ref 3.5–5.0)
Alkaline Phosphatase: 123 U/L (ref 38–126)
Anion gap: 10 (ref 5–15)
BUN: 19 mg/dL (ref 8–23)
CO2: 26 mmol/L (ref 22–32)
Calcium: 9.2 mg/dL (ref 8.9–10.3)
Chloride: 99 mmol/L (ref 98–111)
Creatinine, Ser: 2.11 mg/dL — ABNORMAL HIGH (ref 0.44–1.00)
GFR, Estimated: 23 mL/min — ABNORMAL LOW (ref >60)
Glucose, Bld: 96 mg/dL (ref 70–99)
Potassium: 3.8 mmol/L (ref 3.5–5.1)
Sodium: 135 mmol/L (ref 135–145)
Total Bilirubin: 0.9 mg/dL (ref 0.0–1.2)
Total Protein: 7.1 g/dL (ref 6.5–8.1)

## 2024-07-06 LAB — CBC
HCT: 37.1 % (ref 36.0–46.0)
Hemoglobin: 12 g/dL (ref 12.0–15.0)
MCH: 33.4 pg (ref 26.0–34.0)
MCHC: 32.3 g/dL (ref 30.0–36.0)
MCV: 103.3 fL — ABNORMAL HIGH (ref 80.0–100.0)
Platelets: 180 K/uL (ref 150–400)
RBC: 3.59 MIL/uL — ABNORMAL LOW (ref 3.87–5.11)
RDW: 13.9 % (ref 11.5–15.5)
WBC: 5.8 K/uL (ref 4.0–10.5)
nRBC: 0 % (ref 0.0–0.2)

## 2024-07-06 LAB — LIPID PANEL
Cholesterol: 231 mg/dL — ABNORMAL HIGH (ref 0–200)
HDL: 59 mg/dL (ref >40)
LDL Cholesterol: 142 mg/dL — ABNORMAL HIGH (ref 0–99)
Total CHOL/HDL Ratio: 3.9 ratio
Triglycerides: 149 mg/dL (ref <150)
VLDL: 30 mg/dL (ref 0–40)

## 2024-07-06 LAB — MAGNESIUM: Magnesium: 1.9 mg/dL (ref 1.7–2.4)

## 2024-07-07 ENCOUNTER — Encounter: Payer: Self-pay | Admitting: Cardiology

## 2024-07-07 ENCOUNTER — Ambulatory Visit: Payer: Self-pay | Admitting: Cardiology

## 2024-07-07 DIAGNOSIS — I10 Essential (primary) hypertension: Secondary | ICD-10-CM

## 2024-07-17 ENCOUNTER — Telehealth: Payer: Self-pay | Admitting: Cardiology

## 2024-07-17 NOTE — Telephone Encounter (Signed)
*  STAT* If patient is at the pharmacy, call can be transferred to refill team.   1. Which medications need to be refilled? (please list name of each medication and dose if known)   losartan  (COZAAR ) 25 MG tablet   2. Would you like to learn more about the convenience, safety, & potential cost savings by using the [facility]?   3. Are you open to using the [facility] (Type [facility]. ).  4. Which pharmacy/location (including street and city if local pharmacy) is medication to be sent to?  [facility] - [place], [place] - [address]   5. Do they need a 30 day or 90 day supply?   90 day  Patient stated she is almost out of this medication.

## 2024-07-17 NOTE — Telephone Encounter (Signed)
 I called the patient to let her know that she did have enough Losartan  to last her through the year due to the quantity and refills that was given. She stated that she called [facility] and was told that she didn't have anymore refills. I called them myself and was told that the information was incorrect and that she does have enough. I was given a confirmation number from [facility] staff, which is [id number]. I called the patient back to notify her and she verbally stated that she understands and that she didn't need the confirmation number.

## 2024-12-30 ENCOUNTER — Other Ambulatory Visit (HOSPITAL_COMMUNITY)

## 2025-02-05 ENCOUNTER — Ambulatory Visit: Admitting: Cardiology
